# Patient Record
Sex: Male | Born: 1985
Health system: Southern US, Community
[De-identification: ages and names within clinical notes are randomized; demographics above are authoritative.]

## PROBLEM LIST (undated history)

## (undated) DIAGNOSIS — I10 Essential (primary) hypertension: Secondary | ICD-10-CM

## (undated) DIAGNOSIS — E785 Hyperlipidemia, unspecified: Secondary | ICD-10-CM

## (undated) DIAGNOSIS — E079 Disorder of thyroid, unspecified: Secondary | ICD-10-CM

## (undated) HISTORY — DX: Hyperlipidemia, unspecified: E78.5

## (undated) HISTORY — DX: Disorder of thyroid, unspecified: E07.9

## (undated) HISTORY — DX: Essential (primary) hypertension: I10

---
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
## 2008-03-22 ENCOUNTER — Emergency Department (HOSPITAL_COMMUNITY): Admission: EM | Admit: 2008-03-22 | Discharge: 2008-03-22 | Payer: Self-pay | Admitting: Emergency Medicine

## 2008-03-30 ENCOUNTER — Emergency Department (HOSPITAL_COMMUNITY): Admission: EM | Admit: 2008-03-30 | Discharge: 2008-03-30 | Payer: Self-pay | Admitting: Emergency Medicine

## 2009-10-26 ENCOUNTER — Emergency Department (HOSPITAL_BASED_OUTPATIENT_CLINIC_OR_DEPARTMENT_OTHER): Admission: EM | Admit: 2009-10-26 | Discharge: 2009-10-26 | Payer: Self-pay | Admitting: Emergency Medicine

## 2009-11-16 ENCOUNTER — Emergency Department (HOSPITAL_COMMUNITY): Admission: EM | Admit: 2009-11-16 | Discharge: 2009-11-16 | Payer: Self-pay | Admitting: Family Medicine

## 2012-08-10 ENCOUNTER — Ambulatory Visit (INDEPENDENT_AMBULATORY_CARE_PROVIDER_SITE_OTHER): Payer: 59 | Admitting: Internal Medicine

## 2012-08-10 ENCOUNTER — Encounter: Payer: Self-pay | Admitting: Internal Medicine

## 2012-08-10 ENCOUNTER — Other Ambulatory Visit (INDEPENDENT_AMBULATORY_CARE_PROVIDER_SITE_OTHER): Payer: 59

## 2012-08-10 VITALS — BP 130/90 | HR 79 | Temp 97.7°F | Resp 16 | Wt 189.2 lb

## 2012-08-10 DIAGNOSIS — I10 Essential (primary) hypertension: Secondary | ICD-10-CM

## 2012-08-10 DIAGNOSIS — E7800 Pure hypercholesterolemia, unspecified: Secondary | ICD-10-CM

## 2012-08-10 LAB — CBC WITH DIFFERENTIAL/PLATELET
Basophils Absolute: 0.1 10*3/uL (ref 0.0–0.1)
Basophils Relative: 1.2 % (ref 0.0–3.0)
HCT: 44.8 % (ref 39.0–52.0)
Hemoglobin: 14.8 g/dL (ref 13.0–17.0)
Lymphs Abs: 2.9 10*3/uL (ref 0.7–4.0)
MCHC: 33.1 g/dL (ref 30.0–36.0)
Monocytes Relative: 11.4 % (ref 3.0–12.0)
Neutro Abs: 1.5 10*3/uL (ref 1.4–7.7)
RBC: 4.69 Mil/uL (ref 4.22–5.81)
RDW: 12.3 % (ref 11.5–14.6)

## 2012-08-10 LAB — URINALYSIS, ROUTINE W REFLEX MICROSCOPIC
Bilirubin Urine: NEGATIVE
Hgb urine dipstick: NEGATIVE
Ketones, ur: NEGATIVE
Leukocytes, UA: NEGATIVE
Specific Gravity, Urine: 1.025 (ref 1.000–1.030)
Total Protein, Urine: NEGATIVE
Urine Glucose: NEGATIVE
Urobilinogen, UA: 1 (ref 0.0–1.0)

## 2012-08-10 LAB — LIPID PANEL
HDL: 29.7 mg/dL — ABNORMAL LOW (ref >39.00)
LDL Cholesterol: 138 mg/dL — ABNORMAL HIGH (ref 0–99)
Total CHOL/HDL Ratio: 6
Triglycerides: 63 mg/dL (ref 0.0–149.0)

## 2012-08-10 LAB — COMPREHENSIVE METABOLIC PANEL
ALT: 28 U/L (ref 0–53)
AST: 26 U/L (ref 0–37)
BUN: 13 mg/dL (ref 6–23)
Creatinine, Ser: 1 mg/dL (ref 0.4–1.5)
GFR: 120.38 mL/min (ref >60.00)
Total Bilirubin: 0.8 mg/dL (ref 0.3–1.2)

## 2012-08-10 NOTE — Patient Instructions (Signed)

## 2012-08-10 NOTE — Assessment & Plan Note (Signed)
He is not willing to start an antihypertensive at this time. Today I will check his labs to look for end organ damage and secondary causes. He will work on his lifestyle modifications.

## 2012-08-10 NOTE — Progress Notes (Signed)
  Subjective:    Patient ID: Donald Russell, male    DOB: 1985/09/29, 26 y.o.   MRN: [id number]  Hypertension This is a new problem. The current episode started in the past 7 days. The problem is unchanged. The problem is controlled. Pertinent negatives include no anxiety, blurred vision, chest pain, headaches, malaise/fatigue, neck pain, orthopnea, palpitations, peripheral edema, PND, shortness of breath or sweats. There are no associated agents to hypertension. Risk factors for coronary artery disease include male gender. Past treatments include nothing. The current treatment provides no improvement. There are no compliance problems.       Review of Systems  Constitutional: Negative.  Negative for malaise/fatigue.  HENT: Negative.  Negative for neck pain.   Eyes: Negative.  Negative for blurred vision.  Respiratory: Negative for cough, choking, chest tightness, shortness of breath, wheezing and stridor.   Cardiovascular: Negative for chest pain, palpitations, orthopnea, leg swelling and PND.  Gastrointestinal: Negative for nausea, vomiting, abdominal pain, diarrhea and constipation.  Genitourinary: Negative.   Musculoskeletal: Negative.   Skin: Negative.   Neurological: Negative.  Negative for headaches.  Hematological: Negative for adenopathy. Does not bruise/bleed easily.  Psychiatric/Behavioral: Negative.        Objective:   Physical Exam  Vitals reviewed. Constitutional: He is oriented to person, place, and time. He appears well-developed and well-nourished. No distress.  HENT:  Head: Normocephalic and atraumatic.  Mouth/Throat: Oropharynx is clear and moist. No oropharyngeal exudate.  Eyes: Conjunctivae normal are normal. Right eye exhibits no discharge. Left eye exhibits no discharge. No scleral icterus.  Neck: Normal range of motion. Neck supple. No JVD present. No tracheal deviation present. No thyromegaly present.  Cardiovascular: Normal rate, regular rhythm, normal  heart sounds and intact distal pulses.  Exam reveals no gallop and no friction rub.   No murmur heard. Pulmonary/Chest: Effort normal and breath sounds normal. No stridor. No respiratory distress. He has no wheezes. He has no rales. He exhibits no tenderness.  Abdominal: Soft. Bowel sounds are normal. He exhibits no distension and no mass. There is no tenderness. There is no rebound and no guarding.  Musculoskeletal: Normal range of motion. He exhibits no edema and no tenderness.  Lymphadenopathy:    He has no cervical adenopathy.  Neurological: He is oriented to person, place, and time.  Skin: Skin is warm and dry. No rash noted. He is not diaphoretic. No erythema. No pallor.  Psychiatric: He has a normal mood and affect. His behavior is normal. Judgment and thought content normal.      No results found for this basename: WBC, HGB, HCT, PLT, GLUCOSE, CHOL, TRIG, HDL, LDLDIRECT, LDLCALC, ALT, AST, NA, K, CL, CREATININE, BUN, CO2, TSH, PSA, INR, GLUF, HGBA1C, MICROALBUR      Assessment & Plan:

## 2012-08-10 NOTE — Assessment & Plan Note (Signed)
I will check his FLP TSH and CMP today

## 2015-06-02 ENCOUNTER — Emergency Department (HOSPITAL_COMMUNITY)
Admission: EM | Admit: 2015-06-02 | Discharge: 2015-06-02 | Disposition: A | Discharge status: Home / Self Care | Payer: Worker's Compensation | Attending: Emergency Medicine | Admitting: Emergency Medicine

## 2015-06-02 ENCOUNTER — Emergency Department (HOSPITAL_COMMUNITY): Payer: Worker's Compensation

## 2015-06-02 ENCOUNTER — Encounter (HOSPITAL_COMMUNITY): Payer: Self-pay | Admitting: Emergency Medicine

## 2015-06-02 DIAGNOSIS — S59912A Unspecified injury of left forearm, initial encounter: Secondary | ICD-10-CM | POA: Insufficient documentation

## 2015-06-02 DIAGNOSIS — Z720 Tobacco use: Secondary | ICD-10-CM | POA: Diagnosis not present

## 2015-06-02 DIAGNOSIS — I10 Essential (primary) hypertension: Secondary | ICD-10-CM | POA: Insufficient documentation

## 2015-06-02 DIAGNOSIS — M79602 Pain in left arm: Secondary | ICD-10-CM

## 2015-06-02 DIAGNOSIS — Y9389 Activity, other specified: Secondary | ICD-10-CM | POA: Insufficient documentation

## 2015-06-02 DIAGNOSIS — S6992XA Unspecified injury of left wrist, hand and finger(s), initial encounter: Secondary | ICD-10-CM | POA: Diagnosis not present

## 2015-06-02 DIAGNOSIS — Z8639 Personal history of other endocrine, nutritional and metabolic disease: Secondary | ICD-10-CM | POA: Diagnosis not present

## 2015-06-02 DIAGNOSIS — M25532 Pain in left wrist: Secondary | ICD-10-CM

## 2015-06-02 DIAGNOSIS — Y92410 Unspecified street and highway as the place of occurrence of the external cause: Secondary | ICD-10-CM | POA: Diagnosis not present

## 2015-06-02 DIAGNOSIS — Y998 Other external cause status: Secondary | ICD-10-CM | POA: Insufficient documentation

## 2015-06-02 MED ORDER — NAPROXEN 250 MG PO TABS: 250.0000 mg | ORAL_TABLET | Freq: Two times a day (BID) | ORAL | Status: DC
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
## 2015-06-02 NOTE — ED Notes (Signed)
Awake. Verbally responsive. A/O x4. Resp even and unlabored. No audible adventitious breath sounds noted. ABC's intact.  
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
## 2015-06-10 ENCOUNTER — Encounter: Payer: Self-pay | Admitting: Internal Medicine

## 2015-06-10 ENCOUNTER — Ambulatory Visit (INDEPENDENT_AMBULATORY_CARE_PROVIDER_SITE_OTHER): Payer: 59 | Admitting: Internal Medicine

## 2015-06-10 VITALS — BP 136/88 | HR 89 | Temp 98.6°F | Resp 16 | Wt 195.0 lb

## 2015-06-10 DIAGNOSIS — R51 Headache: Secondary | ICD-10-CM | POA: Diagnosis not present

## 2015-06-10 DIAGNOSIS — R519 Headache, unspecified: Secondary | ICD-10-CM

## 2015-06-10 DIAGNOSIS — I10 Essential (primary) hypertension: Secondary | ICD-10-CM | POA: Diagnosis not present

## 2015-06-10 MED ORDER — TRAMADOL HCL 50 MG PO TABS
50.0000 mg | ORAL_TABLET | Freq: Three times a day (TID) | ORAL | Status: DC | PRN
Start: 2015-06-10 — End: 2016-05-04

## 2015-06-10 MED ORDER — METOPROLOL TARTRATE 25 MG PO TABS: 25.0000 mg | ORAL_TABLET | Freq: Two times a day (BID) | ORAL | Status: DC

## 2015-06-10 NOTE — Patient Instructions (Addendum)
Minimal Blood Pressure Goal= AVERAGE < 140/90;  Ideal is an AVERAGE < 135/85. This AVERAGE should be calculated from @ least 5-7 BP readings taken @ different times of day on different days of week. You should not respond to isolated BP readings , but rather the AVERAGE for that week .Please bring your  blood pressure cuff to office visits to verify that it is reliable.It  can also be checked against the blood pressure device at the pharmacy. Finger or wrist cuffs are not dependable; an arm cuff is.  Fill the  prescription for the BP medication if BP NOT @ goal based on  7 to 14 day average.  Please think about quitting smoking for good. Review the risks we discussed. Please call 1-800-QUIT-NOW [phone] 151-4347) for free smoking cessation counseling. There are multiple options are to help you stop smoking. These include nicotine patches and nicotine gum.  Please keep a diary of your headaches . Document  each occurrence on the calendar with notation of : #1 any prodrome ( any non headache symptom such as marked fatigue,visual changes, ,etc ) which precedes actual headache ; #2) severity on 1-10 scale; #3) any triggers ( food/ drink,enviromenntal or weather changes ,physical or emotional stress) in 8-12 hour period prior to the headache; & #4) response to any medications or other intervention. Please review "Headache" @ [facility] for additional information.

## 2015-06-10 NOTE — Progress Notes (Signed)
Pre visit review using our clinic review tool, if applicable. No additional management support is needed unless otherwise documented below in the visit note. 
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
## 2016-05-04 ENCOUNTER — Encounter (HOSPITAL_COMMUNITY): Payer: Self-pay | Admitting: Family Medicine

## 2016-05-04 ENCOUNTER — Ambulatory Visit: Payer: Self-pay | Admitting: Internal Medicine

## 2016-05-04 ENCOUNTER — Ambulatory Visit (HOSPITAL_COMMUNITY)
Admission: EM | Admit: 2016-05-04 | Discharge: 2016-05-04 | Disposition: A | Discharge status: Home / Self Care | Payer: Commercial Managed Care - HMO | Attending: Family Medicine | Admitting: Family Medicine

## 2016-05-04 DIAGNOSIS — G44209 Tension-type headache, unspecified, not intractable: Secondary | ICD-10-CM

## 2016-05-04 DIAGNOSIS — I10 Essential (primary) hypertension: Secondary | ICD-10-CM

## 2016-05-04 MED ORDER — LISINOPRIL 10 MG PO TABS: 10.0000 mg | ORAL_TABLET | Freq: Every day | ORAL | 0 refills | Status: DC

## 2016-05-04 NOTE — ED Triage Notes (Signed)
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

## 2016-05-04 NOTE — ED Provider Notes (Signed)
[facility]    CSN: [id number] Arrival date & time: 05/04/16  1107  First Provider Contact:  First MD Initiated Contact with Patient 05/04/16 1200     History   Chief Complaint Chief Complaint  Patient presents with  . Hypertension   HPI Donald Russell is a 30 y.o. male presenting for hypertension.   He reports being diagnosed with HTN at ED, followed up with PCP for their first visit and prescribed metoprolol about 1 year ago. He reports feeling sluggish when taking this. He took it once a day (not twice per the prescription) and has been out for the past 2 months. He called PCP today for appointment to be seen for headache and to get a refill, but was told no appointments were available today.   He's had increasing stress at work, a young child, and trouble paying bills and thinks that is causing his headache which starts on both sides of his neck, lasts until he falls asleep, daily for the past week, waxing/waning. He's taken no medications for this. No photophobia, N/V.   Past Medical History:  Diagnosis Date  . Hyperlipidemia   . Hypertension   . Thyroid disease     Patient Active Problem List   Diagnosis Date Noted  . Essential hypertension, benign 08/10/2012  . Pure hypercholesterolemia 08/10/2012    History reviewed. No pertinent surgical history.  Home Medications    Prior to Admission medications   Medication Sig Start Date End Date Taking? Authorizing Provider  lisinopril (PRINIVIL,ZESTRIL) 10 MG tablet Take 1 tablet (10 mg total) by mouth daily. 05/04/16   Tyrone Nineyan B Raianna Slight, MD    Family History Family History  Problem Relation Age of Onset  . Hypertension Mother   . Hypertension Brother   . Alcohol abuse Neg Hx   . Cancer Neg Hx   . Diabetes Neg Hx   . Early death Neg Hx   . Hearing loss Neg Hx   . Heart disease Neg Hx   . Hyperlipidemia Neg Hx   . Stroke Neg Hx     Social History Social History  Substance Use Topics  . Smoking status:  Current Some Day Smoker    Packs/day: 0.25    Years: 3.00    Types: Cigarettes  . Smokeless tobacco: Never Used  . Alcohol use 1.8 oz/week    3 Cans of beer per week     Allergies   Review of patient's allergies indicates no known allergies.   Review of Systems Review of Systems Denies CP, SOB, palpitations, syncope, dizziness, orthopnea, PND, vision changes, claudication, leg swelling.  Physical Exam Triage Vital Signs ED Triage Vitals [05/04/16 1206]  Enc Vitals Group     BP (!) 154/106     Pulse Rate 78     Resp 16     Temp 98.6 F (37 C)     Temp Source Oral     SpO2 100 %     Weight      Height      Head Circumference      Peak Flow      Pain Score      Pain Loc      Pain Edu?      Excl. in GC?    No data found.   Updated Vital Signs BP (!) 154/106 (BP Location: Right Arm)   Pulse 78   Temp 98.6 F (37 C) (Oral)   Resp 16   SpO2 100%  Physical Exam  Constitutional: He is oriented to person, place, and time. He appears well-developed and well-nourished. No distress.  Eyes: EOM are normal. Pupils are equal, round, and reactive to light. No scleral icterus.  fundi benign  Neck: Neck supple. No JVD present.  Cardiovascular: Normal rate, regular rhythm, normal heart sounds and intact distal pulses.   No murmur heard. on manual recheck, 150/90  Pulmonary/Chest: Effort normal and breath sounds normal. No respiratory distress.  Abdominal: Soft. Bowel sounds are normal. He exhibits no distension. There is no tenderness.  Musculoskeletal: Normal range of motion. He exhibits no edema or tenderness.  Lymphadenopathy:    He has no cervical adenopathy.  Neurological: He is alert and oriented to person, place, and time. He exhibits normal muscle tone.  Skin: Skin is warm and dry.  Vitals reviewed.  UC Treatments / Results  Labs (all labs ordered are listed, but only abnormal results are displayed) Labs Reviewed - No data to display  EKG  EKG  Interpretation None       Radiology No results found.  Procedures Procedures (including critical care time)  Medications Ordered in UC Medications - No data to display   Initial Impression / Assessment and Plan / UC Course  I have reviewed the triage vital signs and the nursing notes.  Pertinent labs & imaging results that were available during my care of the patient were reviewed by me and considered in my medical decision making (see chart for details).  Final Clinical Impressions(s) / UC Diagnoses   Final diagnoses:  Essential hypertension, benign  Tension-type headache, not intractable, unspecified chronicity pattern   30 y.o. male with HTN untreated. On my recheck BP is not in severe range, doubt HTN urgency. HA likely tension-type with benign exam. Due to h/o sluggishness with metoprolol, this is discontinued and lisinopril started with precautions about SE's, return precautions for HTN urgency, and directions to f/u with PCP in 2 weeks for BP recheck and Cr, K check.   New Prescriptions New Prescriptions   LISINOPRIL (PRINIVIL,ZESTRIL) 10 MG TABLET    Take 1 tablet (10 mg total) by mouth daily.     Tyrone Nineyan B Jazen Spraggins, MD 05/04/16 1344

## 2016-05-04 NOTE — Discharge Instructions (Signed)
It's very important for you to not run out of medication. START taking lisinopril for high blood pressure. If you notice swelling or a dry cough, contact your PCP. Either way, schedule an appointment in 2 weeks with your PCP for BP recheck and labs. If you experience any worsening symptoms, return for urgent care.

## 2016-09-29 ENCOUNTER — Ambulatory Visit: Payer: Commercial Managed Care - HMO | Admitting: Internal Medicine

## 2018-06-08 ENCOUNTER — Ambulatory Visit: Payer: Self-pay | Admitting: Family Medicine

## 2018-07-05 ENCOUNTER — Ambulatory Visit: Payer: 59 | Admitting: Family Medicine

## 2018-07-05 ENCOUNTER — Encounter: Payer: Self-pay | Admitting: Family Medicine

## 2018-07-05 VITALS — BP 150/102 | HR 89 | Temp 98.0°F | Ht 65.5 in | Wt 206.4 lb

## 2018-07-05 DIAGNOSIS — Z23 Encounter for immunization: Secondary | ICD-10-CM | POA: Diagnosis not present

## 2018-07-05 DIAGNOSIS — I10 Essential (primary) hypertension: Secondary | ICD-10-CM | POA: Diagnosis not present

## 2018-07-05 DIAGNOSIS — L0291 Cutaneous abscess, unspecified: Secondary | ICD-10-CM | POA: Diagnosis not present

## 2018-07-05 DIAGNOSIS — E669 Obesity, unspecified: Secondary | ICD-10-CM | POA: Diagnosis not present

## 2018-07-05 LAB — COMPREHENSIVE METABOLIC PANEL
ALK PHOS: 55 U/L (ref 39–117)
ALT: 26 U/L (ref 0–53)
AST: 20 U/L (ref 0–37)
Albumin: 4.7 g/dL (ref 3.5–5.2)
BILIRUBIN TOTAL: 0.4 mg/dL (ref 0.2–1.2)
BUN: 17 mg/dL (ref 6–23)
CALCIUM: 10.1 mg/dL (ref 8.4–10.5)
CO2: 28 mEq/L (ref 19–32)
CREATININE: 0.94 mg/dL (ref 0.40–1.50)
Chloride: 103 mEq/L (ref 96–112)
GFR: 120.89 mL/min (ref >60.00)
Glucose, Bld: 102 mg/dL — ABNORMAL HIGH (ref 70–99)
Potassium: 4.4 mEq/L (ref 3.5–5.1)
SODIUM: 140 meq/L (ref 135–145)
TOTAL PROTEIN: 7.5 g/dL (ref 6.0–8.3)

## 2018-07-05 LAB — CBC
HCT: 41 % (ref 39.0–52.0)
Hemoglobin: 14 g/dL (ref 13.0–17.0)
MCHC: 34.1 g/dL (ref 30.0–36.0)
MCV: 93.9 fl (ref 78.0–100.0)
PLATELETS: 244 10*3/uL (ref 150.0–400.0)
RBC: 4.36 Mil/uL (ref 4.22–5.81)
RDW: 12.1 % (ref 11.5–15.5)
WBC: 8 10*3/uL (ref 4.0–10.5)

## 2018-07-05 LAB — LIPID PANEL
CHOL/HDL RATIO: 5
CHOLESTEROL: 198 mg/dL (ref 0–200)
HDL: 37.2 mg/dL — AB (ref >39.00)
LDL Cholesterol: 127 mg/dL — ABNORMAL HIGH (ref 0–99)
NonHDL: 160.31
TRIGLYCERIDES: 168 mg/dL — AB (ref 0.0–149.0)
VLDL: 33.6 mg/dL (ref 0.0–40.0)

## 2018-07-05 LAB — TSH: TSH: 3.62 u[IU]/mL (ref 0.35–4.50)

## 2018-07-05 MED ORDER — DOXYCYCLINE HYCLATE 100 MG PO TABS: 100.0000 mg | ORAL_TABLET | Freq: Two times a day (BID) | ORAL | 0 refills | Status: DC

## 2018-07-05 MED ORDER — NAPROXEN 500 MG PO TABS: 500.0000 mg | ORAL_TABLET | Freq: Two times a day (BID) | ORAL | 0 refills | Status: DC

## 2018-07-05 MED ORDER — AMLODIPINE BESYLATE 5 MG PO TABS: 5.0000 mg | ORAL_TABLET | Freq: Every day | ORAL | 3 refills | Status: DC

## 2018-07-05 NOTE — Assessment & Plan Note (Addendum)
-  BP is uncontrolled.  -Reports side effects with lisinopril and metoprolol previously. -Will try amlodipine 5mg .  -Discussed low sodium/DASH diet -F/u in 1 month.

## 2018-07-05 NOTE — Patient Instructions (Signed)

## 2018-07-05 NOTE — Progress Notes (Signed)
Donald Russell - 32 y.o. male MRN [id number]  Date of birth: [birth year]  Subjective Chief Complaint  Patient presents with  . Hypertension  . Cyst    HPI Donald Russell is a 32 y.o. male with history of HTN here today to establish care and follow up on HTN.  He also has swollen area on L side of face.    -HTN:  Previously treated with metoprolol however felt too sluggish while taking this, changed to lisinopril however has been off of this for nearly a year.  Reports he felt "funny" while taking this medication, so he stopped it.  He is unable to recall specifics of how this made him feel.  He denies any symptoms related to hypertension at this time including chest pain, shortness of breath, palpitations, or headaches.  He does admit that his diet could be better in regards to salt intake.  He does do weightlifting for exercise regularly.   -Swollen area on face:  Noticed swollen area to L jaw line about 4-5 days ago.  Thinks it is an ingrown hair, similar to previous ingrown hairs but this one is larger.  Area if painful to touch and he also has some mild neck pain.  He is using ibuprofen as needed which provides relief.  He has had some drainage from this area.  Denies fever or chills.   ROS:  A comprehensive ROS was completed and negative except as noted per HPI  No Known Allergies  Past Medical History:  Diagnosis Date  . Hyperlipidemia   . Hypertension   . Thyroid disease     History reviewed. No pertinent surgical history.  Social History   Socioeconomic History  . Marital status: Single    Spouse name: Not on file  . Number of children: Not on file  . Years of education: Not on file  . Highest education level: Not on file  Occupational History  . Not on file  Social Needs  . Financial resource strain: Not on file  . Food insecurity:    Worry: Not on file    Inability: Not on file  . Transportation needs:    Medical: Not on file    Non-medical: Not on file    Tobacco Use  . Smoking status: Former Smoker    Packs/day: 0.25    Years: 3.00    Pack years: 0.75    Types: Cigarettes  . Smokeless tobacco: Never Used  Substance and Sexual Activity  . Alcohol use: Yes    Alcohol/week: 3.0 standard drinks    Types: 3 Cans of beer per week  . Drug use: No  . Sexual activity: Yes  Lifestyle  . Physical activity:    Days per week: Not on file    Minutes per session: Not on file  . Stress: Not on file  Relationships  . Social connections:    Talks on phone: Not on file    Gets together: Not on file    Attends religious service: Not on file    Active member of club or organization: Not on file    Attends meetings of clubs or organizations: Not on file    Relationship status: Not on file  Other Topics Concern  . Not on file  Social History Narrative  . Not on file    Family History  Problem Relation Age of Onset  . Hypertension Mother   . Hypertension Brother   . Kidney disease Brother   .  Alcohol abuse Neg Hx   . Cancer Neg Hx   . Diabetes Neg Hx   . Early death Neg Hx   . Hearing loss Neg Hx   . Heart disease Neg Hx   . Hyperlipidemia Neg Hx   . Stroke Neg Hx     Health Maintenance  Topic Date Due  . TETANUS/TDAP  02/22/2007  . INFLUENZA VACCINE  07/05/2018 (Originally 04/19/2018)  . HIV Screening  07/06/2019 (Originally 02/22/2003)    ----------------------------------------------------------------------------------------------------------------------------------------------------------------------------------------------------------------- Physical Exam BP (!) 150/102   Pulse 89   Temp 98 F (36.7 C) (Oral)   Ht 5' 5.5" (1.664 m)   Wt 206 lb 6.4 oz (93.6 kg)   SpO2 98%   BMI 33.82 kg/m   Physical Exam  Constitutional: He is oriented to person, place, and time. He appears well-nourished. No distress.  HENT:  Head: Normocephalic and atraumatic.  Mouth/Throat: Oropharynx is clear and moist.  Eyes: No scleral icterus.   Neck: Neck supple. No thyromegaly present.  Cardiovascular: Normal rate, regular rhythm, normal heart sounds and intact distal pulses.  Pulmonary/Chest: Effort normal and breath sounds normal.  Musculoskeletal: He exhibits no edema.  Neurological: He is alert and oriented to person, place, and time. No cranial nerve deficit.  Skin:  Indurated area along L jaw line with ttp and surrounding erythema.  Mild fluctuance with spontaneous drainage.    Psychiatric: He has a normal mood and affect. His behavior is normal.    ------------------------------------------------------------------------------------------------------------------------------------------------------------------------------------------------------------------- Assessment and Plan  Abscess -Area with spontaneous drainage, encouraged use of warm compress -Rx for doxycycline for surrounding cellulitis.  -Instructed to return for worsening or failure to resolve with antibiotics.   Essential hypertension -BP is uncontrolled.  -Reports side effects with lisinopril and metoprolol previously. -Will try amlodipine 5mg .  -Discussed low sodium/DASH diet -F/u in 1 month.

## 2018-07-05 NOTE — Assessment & Plan Note (Signed)
-  Area with spontaneous drainage, encouraged use of warm compress -Rx for doxycycline for surrounding cellulitis.  -Instructed to return for worsening or failure to resolve with antibiotics.

## 2018-07-06 ENCOUNTER — Ambulatory Visit: Payer: 59 | Admitting: Family Medicine

## 2018-07-10 NOTE — Progress Notes (Signed)
-  Cholesterol is a little high.  Can be improved with low fat diet and regular exercise.  No medication needed at this time.  -Other labs are normal.

## 2019-01-14 ENCOUNTER — Telehealth: Payer: Self-pay | Admitting: Family Medicine

## 2019-01-14 NOTE — Telephone Encounter (Signed)
Copied from CRM [id number]. Topic: Quick Communication - Rx Refill/Question >> Jan 14, 2019  4:54 PM Mcneil, Ja-Kwan wrote: Medication: amLODipine (NORVASC) 5 MG tablet  Has the patient contacted their pharmacy? no  Preferred Pharmacy (with phone number or street name): [facility] [address] [place], [place] - [address] [phone] (Phone)  [phone] (Fax)  Agent: Please be advised that RX refills may take up to 3 business days. We ask that you follow-up with your pharmacy.

## 2019-01-15 NOTE — Telephone Encounter (Signed)
Pt needs OV . LOV was 07/07/2018. Will call to get scheduled.

## 2019-01-15 NOTE — Telephone Encounter (Signed)
Patient has appointment scheduled for 4/29.

## 2019-01-15 NOTE — Telephone Encounter (Signed)
Called Pt. Left a message to make appt w/Dr. Ashley Royalty.

## 2019-01-15 NOTE — Telephone Encounter (Signed)
[facility]. Was given d different phone number ( [phone]) . Tried calling that number, left a message to call to make appt.

## 2019-01-16 ENCOUNTER — Encounter: Payer: Self-pay | Admitting: Family Medicine

## 2019-01-16 ENCOUNTER — Ambulatory Visit (INDEPENDENT_AMBULATORY_CARE_PROVIDER_SITE_OTHER): Payer: 59 | Admitting: Family Medicine

## 2019-01-16 DIAGNOSIS — I10 Essential (primary) hypertension: Secondary | ICD-10-CM

## 2019-01-16 MED ORDER — AMLODIPINE BESYLATE 5 MG PO TABS: 5.0000 mg | ORAL_TABLET | Freq: Every day | ORAL | 1 refills | Status: DC

## 2019-01-16 NOTE — Assessment & Plan Note (Signed)
-  Recommend that he check BP at home some, he will look in to purchasing a home cuff.  -Continue low salt diet.  -Continue amlodipine.  -F/u in clinic in 3 months.

## 2019-01-16 NOTE — Progress Notes (Signed)
Donald KaufmannDarryl D J Russell - 33 y.o. male MRN [id number]  Date of birth: 08/05/1986   This visit type was conducted due to national recommendations for restrictions regarding the COVID-19 Pandemic (e.g. social distancing).  This format is felt to be most appropriate for this patient at this time.  All issues noted in this document were discussed and addressed.  No physical exam was performed (except for noted visual exam findings with Video Visits).  I discussed the limitations of evaluation and management by telemedicine and the availability of in person appointments. The patient expressed understanding and agreed to proceed.  I connected with@ on 01/16/19 at  9:00 AM EDT by a video enabled telemedicine application and verified that I am speaking with the correct person using two identifiers.   [place] [address] [place] [id number]   [facility]  Chief Complaint  Patient presents with  . Hypertension    F/U , Meds Refills    HPI  Donald Russell is a 33 y.o. male who presents via audio/video conferencing for a telehealth visit today.  He is following up today for HTN.  Last seen in 06/2018.  Started on amlodipine at that time.  Reports he has been doing well since starting this.  He has checked BP several months ago and it had improved.  He does not have any recent readings.  He denies any symptoms related to hypertension including chest pain, shortness of breath, headache or vision changes.  He has been watching salt intake and exercising daily.    ROS:  A comprehensive ROS was completed and negative except as noted per HPI  Past Medical History:  Diagnosis Date  . Hyperlipidemia   . Hypertension   . Thyroid disease     No past surgical history on file.  Family History  Problem Relation Age of Onset  . Hypertension Mother   . Hypertension Brother   . Kidney disease Brother   . Alcohol abuse Neg Hx   . Cancer Neg Hx   . Diabetes Neg  Hx   . Early death Neg Hx   . Hearing loss Neg Hx   . Heart disease Neg Hx   . Hyperlipidemia Neg Hx   . Stroke Neg Hx     Social History   Socioeconomic History  . Marital status: Single    Spouse name: Not on file  . Number of children: Not on file  . Years of education: Not on file  . Highest education level: Not on file  Occupational History  . Not on file  Social Needs  . Financial resource strain: Not on file  . Food insecurity:    Worry: Not on file    Inability: Not on file  . Transportation needs:    Medical: Not on file    Non-medical: Not on file  Tobacco Use  . Smoking status: Former Smoker    Packs/day: 0.25    Years: 3.00    Pack years: 0.75    Types: Cigarettes  . Smokeless tobacco: Never Used  Substance and Sexual Activity  . Alcohol use: Yes    Alcohol/week: 3.0 standard drinks    Types: 3 Cans of beer per week  . Drug use: No  . Sexual activity: Yes  Lifestyle  . Physical activity:    Days per week: Not on file    Minutes per session: Not on file  . Stress: Not on file  Relationships  .  Social connections:    Talks on phone: Not on file    Gets together: Not on file    Attends religious service: Not on file    Active member of club or organization: Not on file    Attends meetings of clubs or organizations: Not on file    Relationship status: Not on file  . Intimate partner violence:    Fear of current or ex partner: Not on file    Emotionally abused: Not on file    Physically abused: Not on file    Forced sexual activity: Not on file  Other Topics Concern  . Not on file  Social History Narrative  . Not on file     Current Outpatient Medications:  .  amLODipine (NORVASC) 5 MG tablet, Take 1 tablet (5 mg total) by mouth daily., Disp: 90 tablet, Rfl: 1  EXAM:  VITALS per patient if applicable: Ht 5' 5.5" (1.664 m)   Wt 205 lb (93 kg) Comment: PT reports  BMI 33.59 kg/m   GENERAL: alert, oriented, appears well and in no acute  distress  HEENT: atraumatic, conjunttiva clear, no obvious abnormalities on inspection of external nose and ears  NECK: normal movements of the head and neck  LUNGS: on inspection no signs of respiratory distress, breathing rate appears normal, no obvious gross SOB, gasping or wheezing  CV: no obvious cyanosis  MS: moves all visible extremities without noticeable abnormality  PSYCH/NEURO: pleasant and cooperative, no obvious depression or anxiety, speech and thought processing grossly intact  ASSESSMENT AND PLAN:  Discussed the following assessment and plan:  Essential hypertension -Recommend that he check BP at home some, he will look in to purchasing a home cuff.  -Continue low salt diet.  -Continue amlodipine.  -F/u in clinic in 3 months.        I discussed the assessment and treatment plan with the patient. The patient was provided an opportunity to ask questions and all were answered. The patient agreed with the plan and demonstrated an understanding of the instructions.   The patient was advised to call back or seek an in-person evaluation if the symptoms worsen or if the condition fails to improve as anticipated.    Everrett Coombe, DO

## 2019-01-16 NOTE — Telephone Encounter (Signed)
Pt completed DOXY.ME VV. Refill will be completed at time of visit.

## 2019-01-21 ENCOUNTER — Ambulatory Visit: Payer: 59 | Admitting: Family Medicine

## 2019-01-21 ENCOUNTER — Encounter: Payer: Self-pay | Admitting: Family Medicine

## 2019-01-21 DIAGNOSIS — F321 Major depressive disorder, single episode, moderate: Secondary | ICD-10-CM | POA: Diagnosis not present

## 2019-01-21 DIAGNOSIS — I10 Essential (primary) hypertension: Secondary | ICD-10-CM

## 2019-01-21 DIAGNOSIS — B372 Candidiasis of skin and nail: Secondary | ICD-10-CM

## 2019-01-21 MED ORDER — KETOCONAZOLE 2 % EX CREA: 1.0000 "application " | TOPICAL_CREAM | Freq: Every day | CUTANEOUS | 0 refills | Status: DC

## 2019-01-21 NOTE — Assessment & Plan Note (Signed)
-  BP is high today, currently off medication. -encouraged to pick up and restart this immediately.  -f/u in 6 weeks for BP recheck.

## 2019-01-21 NOTE — Assessment & Plan Note (Signed)
-  Discussed recommendations for trial of medication, however he does not want to try this at this time.  -Given number to schedule with therapist, he will consider this.  -He will contact me if symptoms continue, worsen and/or he would like to try medication.

## 2019-01-21 NOTE — Progress Notes (Signed)
Donald KaufmannDarryl D J Russell - 33 y.o. male MRN [id number]  Date of birth: 05/03/1986  Subjective Chief Complaint  Patient presents with  . Rash    in groin area, irr/redness/protection used, partner recent Yeast infection 2-3 wks ago     HPI Donald Russell is a 33 y.o. male here today with concern of rash.  He also has symptoms of depression and would like to f/u on HTN.   -Rash:  Rash located in groin area, noticed about 2 weeks ago.  Describes as irritation with occasional itching.  He denies any swelling or drainage from the area.  He denies bumps/sores to this area. He has not had fever, chills, testicular pain or penile discharge.  He is using cortisone to area without improvement.  He has used condoms with intercourse every time.   -Depression:  Reports increasing depressive symptoms over the past couple of months.  States that having to stay home due to COVID has worsened this due to isolation.  He also reports that he and the mother of his child are not getting along but are quarantining together for the child, however this has put a tremendous amount of stress on him.  He reports that he is only eating once per day, sleeping poorly, and has little motivation.  He is continuing to exercise which helps some.  He does not want to take medication but will consider counseling.  He denies SI/HI  -HTN:  Out of medication for at least the past week as he has not picked up refills due to depression and not wanting to leave the house.  Has had mild headache, denies vision changes or nausea.  He has not had chest pain, or shortness of breath.   ROS:  A comprehensive ROS was completed and negative except as noted per HPI  No Known Allergies  Past Medical History:  Diagnosis Date  . Hyperlipidemia   . Hypertension   . Thyroid disease     No past surgical history on file.  Social History   Socioeconomic History  . Marital status: Single    Spouse name: Not on file  . Number of children: Not on  file  . Years of education: Not on file  . Highest education level: Not on file  Occupational History  . Not on file  Social Needs  . Financial resource strain: Not on file  . Food insecurity:    Worry: Not on file    Inability: Not on file  . Transportation needs:    Medical: Not on file    Non-medical: Not on file  Tobacco Use  . Smoking status: Former Smoker    Packs/day: 0.25    Years: 3.00    Pack years: 0.75    Types: Cigarettes  . Smokeless tobacco: Never Used  Substance and Sexual Activity  . Alcohol use: Yes    Alcohol/week: 3.0 standard drinks    Types: 3 Cans of beer per week  . Drug use: No  . Sexual activity: Yes  Lifestyle  . Physical activity:    Days per week: Not on file    Minutes per session: Not on file  . Stress: Not on file  Relationships  . Social connections:    Talks on phone: Not on file    Gets together: Not on file    Attends religious service: Not on file    Active member of club or organization: Not on file    Attends meetings of  clubs or organizations: Not on file    Relationship status: Not on file  Other Topics Concern  . Not on file  Social History Narrative  . Not on file    Family History  Problem Relation Age of Onset  . Hypertension Mother   . Hypertension Brother   . Kidney disease Brother   . Alcohol abuse Neg Hx   . Cancer Neg Hx   . Diabetes Neg Hx   . Early death Neg Hx   . Hearing loss Neg Hx   . Heart disease Neg Hx   . Hyperlipidemia Neg Hx   . Stroke Neg Hx     Health Maintenance  Topic Date Due  . HIV Screening  07/06/2019 (Originally 02/22/2003)  . INFLUENZA VACCINE  04/20/2019  . TETANUS/TDAP  07/05/2028    ----------------------------------------------------------------------------------------------------------------------------------------------------------------------------------------------------------------- Physical Exam BP (!) 160/122 (BP Location: Left Arm, Patient Position: Sitting, Cuff  Size: Large)   Pulse 73   Temp 97.7 F (36.5 C) (Oral)   Wt 196 lb 3.2 oz (89 kg)   SpO2 96%   BMI 32.15 kg/m   Physical Exam Constitutional:      Appearance: Normal appearance.  HENT:     Head: Normocephalic and atraumatic.     Nose: Nose normal.     Mouth/Throat:     Mouth: Mucous membranes are moist.  Eyes:     General: No scleral icterus. Neck:     Musculoskeletal: Neck supple.  Cardiovascular:     Rate and Rhythm: Normal rate and regular rhythm.  Pulmonary:     Effort: Pulmonary effort is normal.     Breath sounds: Normal breath sounds.  Skin:    Comments: Erythema and mild ulceration along groin and inner thigh.  No vesicular lesions, testicular swelling or penile discharge.   Neurological:     General: No focal deficit present.     Mental Status: He is alert.  Psychiatric:        Mood and Affect: Mood normal.        Behavior: Behavior normal.     ------------------------------------------------------------------------------------------------------------------------------------------------------------------------------------------------------------------- Assessment and Plan  Candidal intertrigo -rx for ketoconazole cream -Keep area clean and dry -Discussed that this did not have appearance of typical STI and he declines any testing at this time as well.   MDD (major depressive disorder), single episode, moderate (HCC) -Discussed recommendations for trial of medication, however he does not want to try this at this time.  -Given number to schedule with therapist, he will consider this.  -He will contact me if symptoms continue, worsen and/or he would like to try medication.   Essential hypertension -BP is high today, currently off medication. -encouraged to pick up and restart this immediately.  -f/u in 6 weeks for BP recheck.

## 2019-01-21 NOTE — Assessment & Plan Note (Signed)
-  rx for ketoconazole cream -Keep area clean and dry -Discussed that this did not have appearance of typical STI and he declines any testing at this time as well.

## 2019-01-21 NOTE — Patient Instructions (Signed)
-  Be sure to restart blood pressure medication -I recommend that you see a counselor for your depression symptoms -I would also recommend medication to help with this, please let me know if you would like to try this as discussed today.  -Follow up with me in 6 weeks for blood pressure recheck.  -I have sent cream in for the rash you have as well.    Major Depressive Disorder, Adult Major depressive disorder (MDD) is a mental health condition. MDD often makes you feel sad, hopeless, or helpless. MDD can also cause symptoms in your body. MDD can affect your:  Work.  School.  Relationships.  Other normal activities. MDD can range from mild to very bad. It may occur once (single episode MDD). It can also occur many times (recurrent MDD). The main symptoms of MDD often include:  Feeling sad, depressed, or irritable most of the time.  Loss of interest. MDD symptoms also include:  Sleeping too much or too little.  Eating too much or too little.  A change in your weight.  Feeling tired (fatigue) or having low energy.  Feeling worthless.  Feeling guilty.  Trouble making decisions.  Trouble thinking clearly.  Thoughts of suicide or harming others.  Feeling weak.  Feeling agitated.  Keeping yourself from being around other people (isolation). Follow these instructions at home: Activity  Do these things as told by your doctor: ? Go back to your normal activities. ? Exercise regularly. ? Spend time outdoors. Alcohol  Talk with your doctor about how alcohol can affect your antidepressant medicines.  Do not drink alcohol. Or, limit how much alcohol you drink. ? This means no more than 1 drink a day for nonpregnant women and 2 drinks a day for men. One drink equals one of these:  12 oz of beer.  5 oz of wine.  1 oz of hard liquor. General instructions  Take over-the-counter and prescription medicines only as told by your doctor.  Eat a healthy diet.  Get  plenty of sleep.  Find activities that you enjoy. Make time to do them.  Think about joining a support group. Your doctor may be able to suggest a group for you.  Keep all follow-up visits as told by your doctor. This is important. Where to find more information:  [employer] on Mental Illness: ? www.nami.org  [place] [employer] of Mental Health: ? http://www.maynard.net/  National Suicide Prevention Lifeline: ? (380)635-0737. This is free, 24-hour help. Contact a doctor if:  Your symptoms get worse.  You have new symptoms. Get help right away if:  You self-harm.  You see, hear, taste, smell, or feel things that are not present (hallucinate). If you ever feel like you may hurt yourself or others, or have thoughts about taking your own life, get help right away. You can go to your nearest emergency department or call:  Your local emergency services (911 in the [place]  A suicide crisis helpline, such as the National Suicide Prevention Lifeline: ? 9407349949. This is open 24 hours a day. This information is not intended to replace advice given to you by your health care provider. Make sure you discuss any questions you have with your health care provider. Document Released: 08/17/2015 Document Revised: 05/22/2016 Document Reviewed: 05/22/2016 Elsevier Interactive Patient Education  2019 [employer].

## 2019-01-24 ENCOUNTER — Telehealth: Payer: Self-pay | Admitting: Family Medicine

## 2019-01-24 ENCOUNTER — Other Ambulatory Visit: Payer: Self-pay | Admitting: Family Medicine

## 2019-01-24 DIAGNOSIS — Z202 Contact with and (suspected) exposure to infections with a predominantly sexual mode of transmission: Secondary | ICD-10-CM

## 2019-01-24 NOTE — Telephone Encounter (Signed)
Lab orders entered

## 2019-01-24 NOTE — Telephone Encounter (Signed)
Patient called in and said he wants to get STD testing, I know he had an appt on 01/21/2019 so I wasn't sure if this is something Dr Ashley Royalty wants to him to have done. Sending message to Dr Ashley Royalty and Nurse Raynelle Fanning

## 2019-01-25 ENCOUNTER — Other Ambulatory Visit: Payer: Self-pay

## 2019-01-25 ENCOUNTER — Encounter: Payer: Self-pay | Admitting: Family Medicine

## 2019-01-25 ENCOUNTER — Other Ambulatory Visit (INDEPENDENT_AMBULATORY_CARE_PROVIDER_SITE_OTHER): Payer: 59

## 2019-01-25 ENCOUNTER — Other Ambulatory Visit (HOSPITAL_COMMUNITY)
Admission: RE | Admit: 2019-01-25 | Discharge: 2019-01-25 | Disposition: A | Discharge status: Home / Self Care | Payer: 59 | Source: Ambulatory Visit | Attending: Family Medicine | Admitting: Family Medicine

## 2019-01-25 DIAGNOSIS — Z202 Contact with and (suspected) exposure to infections with a predominantly sexual mode of transmission: Secondary | ICD-10-CM | POA: Diagnosis not present

## 2019-01-25 NOTE — Telephone Encounter (Signed)
Pt is scheduled. He is aware.

## 2019-01-28 ENCOUNTER — Telehealth: Payer: Self-pay

## 2019-01-28 LAB — RPR: RPR Ser Ql: NONREACTIVE

## 2019-01-28 LAB — HIV ANTIBODY (ROUTINE TESTING W REFLEX): HIV 1&2 Ab, 4th Generation: NONREACTIVE

## 2019-01-28 LAB — URINE CYTOLOGY ANCILLARY ONLY
Chlamydia: NEGATIVE
Neisseria Gonorrhea: NEGATIVE

## 2019-01-28 NOTE — Telephone Encounter (Signed)
Copied from CRM [id number]. Topic: General - Inquiry >> Jan 28, 2019  2:28 PM Lorayne Bender wrote: Reason for CRM:   Pt calling to get lab results.

## 2019-01-29 NOTE — Progress Notes (Signed)
Called Pt . He is aware.

## 2019-01-29 NOTE — Progress Notes (Signed)
Negative std testing

## 2019-01-31 ENCOUNTER — Other Ambulatory Visit: Payer: Self-pay | Admitting: Family Medicine

## 2019-01-31 ENCOUNTER — Encounter: Payer: Self-pay | Admitting: Family Medicine

## 2019-01-31 MED ORDER — MUPIROCIN 2 % EX OINT: 1.0000 "application " | TOPICAL_OINTMENT | Freq: Two times a day (BID) | CUTANEOUS | 0 refills | Status: DC

## 2019-01-31 MED ORDER — FLUCONAZOLE 150 MG PO TABS: 150.0000 mg | ORAL_TABLET | Freq: Once | ORAL | 0 refills | Status: AC

## 2019-01-31 NOTE — Telephone Encounter (Signed)
I am going to send in a different topical medication and diflucan tab.  If not clearing up with this let me know.

## 2019-02-04 ENCOUNTER — Encounter: Payer: Self-pay | Admitting: Family Medicine

## 2019-02-05 NOTE — Telephone Encounter (Signed)
Sure

## 2019-02-28 ENCOUNTER — Encounter: Payer: Self-pay | Admitting: Family Medicine

## 2019-03-04 ENCOUNTER — Encounter: Payer: Self-pay | Admitting: Family Medicine

## 2019-03-04 ENCOUNTER — Telehealth (INDEPENDENT_AMBULATORY_CARE_PROVIDER_SITE_OTHER): Payer: 59 | Admitting: Family Medicine

## 2019-03-04 DIAGNOSIS — R21 Rash and other nonspecific skin eruption: Secondary | ICD-10-CM

## 2019-03-04 DIAGNOSIS — I10 Essential (primary) hypertension: Secondary | ICD-10-CM

## 2019-03-04 NOTE — Progress Notes (Signed)
Donald Russell - 33 y.o. male MRN [id number]  Date of birth: 1986-08-11   This visit type was conducted due to national recommendations for restrictions regarding the COVID-19 Pandemic (e.g. social distancing).  This format is felt to be most appropriate for this patient at this time.  All issues noted in this document were discussed and addressed.  No physical exam was performed (except for noted visual exam findings with Video Visits).  I discussed the limitations of evaluation and management by telemedicine and the availability of in person appointments. The patient expressed understanding and agreed to proceed.  I connected with@ on 03/04/19 at 10:30 AM EDT by a video enabled telemedicine application and verified that I am speaking with the correct person using two identifiers.   Patient Location: Home [address] [place] [address]   Provider location:   [facility]  Chief Complaint  Patient presents with  . Follow-up    6 wk F/U HTN , meds ok , 1400's/150's/90's100's B.P. ranges     HPI  Donald Russell is a 33 y.o. male who presents via audio/video conferencing for a telehealth visit today.  He Is following up today for HTN and groin rash.  He reports that groin rash has resolved with use of mupirocin ointment.  He denies any pain or swelling.   In regards to his BP he reports that he is doing well with current medication.  Hasn't checked BP in several weeks because he lost his BP cuff.  He reports previous reading 140-150/90.  He states he can tell a big difference if he misses a dose.  He denies chest pain, shortness of breath, palpitations, headache or vision change.     ROS:  A comprehensive ROS was completed and negative except as noted per HPI  Past Medical History:  Diagnosis Date  . Hyperlipidemia   . Hypertension   . Thyroid disease     No past surgical history on file.  Family History  Problem Relation Age of Onset  . Hypertension Mother   .  Hypertension Brother   . Kidney disease Brother   . Alcohol abuse Neg Hx   . Cancer Neg Hx   . Diabetes Neg Hx   . Early death Neg Hx   . Hearing loss Neg Hx   . Heart disease Neg Hx   . Hyperlipidemia Neg Hx   . Stroke Neg Hx     Social History   Socioeconomic History  . Marital status: Single    Spouse name: Not on file  . Number of children: Not on file  . Years of education: Not on file  . Highest education level: Not on file  Occupational History  . Not on file  Social Needs  . Financial resource strain: Not on file  . Food insecurity    Worry: Not on file    Inability: Not on file  . Transportation needs    Medical: Not on file    Non-medical: Not on file  Tobacco Use  . Smoking status: Former Smoker    Packs/day: 0.25    Years: 3.00    Pack years: 0.75    Types: Cigarettes  . Smokeless tobacco: Never Used  Substance and Sexual Activity  . Alcohol use: Yes    Alcohol/week: 3.0 standard drinks    Types: 3 Cans of beer per week  . Drug use: No  . Sexual activity: Yes  Lifestyle  . Physical activity  Days per week: Not on file    Minutes per session: Not on file  . Stress: Not on file  Relationships  . Social Musicianconnections    Talks on phone: Not on file    Gets together: Not on file    Attends religious service: Not on file    Active member of club or organization: Not on file    Attends meetings of clubs or organizations: Not on file    Relationship status: Not on file  . Intimate partner violence    Fear of current or ex partner: Not on file    Emotionally abused: Not on file    Physically abused: Not on file    Forced sexual activity: Not on file  Other Topics Concern  . Not on file  Social History Narrative  . Not on file     Current Outpatient Medications:  .  amLODipine (NORVASC) 5 MG tablet, Take 1 tablet (5 mg total) by mouth daily., Disp: 90 tablet, Rfl: 1  EXAM:  VITALS per patient if applicable: Ht 5' 5.5" (1.664 m)   Wt 196 lb  (88.9 kg)   BMI 32.12 kg/m   GENERAL: alert, oriented, appears well and in no acute distress  HEENT: atraumatic, conjunttiva clear, no obvious abnormalities on inspection of external nose and ears  NECK: normal movements of the head and neck  LUNGS: on inspection no signs of respiratory distress, breathing rate appears normal, no obvious gross SOB, gasping or wheezing  CV: no obvious cyanosis  MS: moves all visible extremities without noticeable abnormality  PSYCH/NEURO: pleasant and cooperative, no obvious depression or anxiety, speech and thought processing grossly intact  ASSESSMENT AND PLAN:  Discussed the following assessment and plan:  Rash -resolved with mupirocin   Essential hypertension -BP previously not well controlled, he will find cuff and check at home.  -I asked him to send me updated readings via mychart, if still running high will plan to increase amlodipine to 10mg  daily.  -Discussed low salt/DASH diet.         I discussed the assessment and treatment plan with the patient. The patient was provided an opportunity to ask questions and all were answered. The patient agreed with the plan and demonstrated an understanding of the instructions.   The patient was advised to call back or seek an in-person evaluation if the symptoms worsen or if the condition fails to improve as anticipated.     Donald Coombeody Mykel Mohl, DO

## 2019-03-04 NOTE — Assessment & Plan Note (Signed)
-  BP previously not well controlled, he will find cuff and check at home.  -I asked him to send me updated readings via mychart, if still running high will plan to increase amlodipine to 10mg  daily.  -Discussed low salt/DASH diet.

## 2019-03-04 NOTE — Assessment & Plan Note (Signed)
-  resolved with mupirocin

## 2019-05-30 ENCOUNTER — Encounter: Payer: Self-pay | Admitting: Family Medicine

## 2019-05-30 MED ORDER — AMLODIPINE BESYLATE 5 MG PO TABS: 5.0000 mg | ORAL_TABLET | Freq: Every day | ORAL | 1 refills | Status: DC

## 2019-10-25 ENCOUNTER — Telehealth: Payer: Self-pay | Admitting: Family Medicine

## 2019-10-25 ENCOUNTER — Ambulatory Visit: Payer: 59 | Admitting: Family Medicine

## 2019-10-25 ENCOUNTER — Other Ambulatory Visit: Payer: Self-pay

## 2019-10-25 NOTE — Telephone Encounter (Signed)
Pt called and wanted to know if he could be seen Monday since he went to the wrong office today for his appointment, The only slot you have is 11:30 but I know that is set for hospital follow ups, Would it be ok to schedule there or should I wait until Monday and if nobody is scheduled can I schedule then.

## 2019-10-25 NOTE — Telephone Encounter (Signed)
Please ignore this note Dr Ashley Royalty, I sent to you by mistake

## 2019-10-25 NOTE — Telephone Encounter (Signed)
Yes that would be okay

## 2019-10-29 ENCOUNTER — Encounter: Payer: 59 | Admitting: Family Medicine

## 2019-10-31 ENCOUNTER — Ambulatory Visit: Payer: 59 | Admitting: Family Medicine

## 2019-12-17 ENCOUNTER — Encounter: Payer: Self-pay | Admitting: Family Medicine

## 2020-01-22 ENCOUNTER — Encounter: Payer: Self-pay | Admitting: Family Medicine

## 2020-01-22 ENCOUNTER — Other Ambulatory Visit: Payer: Self-pay

## 2020-01-22 MED ORDER — AMLODIPINE BESYLATE 5 MG PO TABS: 5.0000 mg | ORAL_TABLET | Freq: Every day | ORAL | 0 refills | Status: DC

## 2020-01-31 ENCOUNTER — Ambulatory Visit: Payer: 59 | Admitting: Family Medicine

## 2020-02-07 ENCOUNTER — Ambulatory Visit: Payer: 59 | Admitting: Family Medicine

## 2020-07-17 ENCOUNTER — Ambulatory Visit (INDEPENDENT_AMBULATORY_CARE_PROVIDER_SITE_OTHER): Payer: 59 | Admitting: Family Medicine

## 2020-07-17 ENCOUNTER — Encounter: Payer: Self-pay | Admitting: Family Medicine

## 2020-07-17 ENCOUNTER — Other Ambulatory Visit: Payer: Self-pay

## 2020-07-17 VITALS — BP 163/101 | HR 89 | Temp 97.9°F | Wt 185.5 lb

## 2020-07-17 DIAGNOSIS — M545 Low back pain, unspecified: Secondary | ICD-10-CM | POA: Diagnosis not present

## 2020-07-17 DIAGNOSIS — I10 Essential (primary) hypertension: Secondary | ICD-10-CM

## 2020-07-17 DIAGNOSIS — M5136 Other intervertebral disc degeneration, lumbar region: Secondary | ICD-10-CM | POA: Insufficient documentation

## 2020-07-17 MED ORDER — AMLODIPINE BESYLATE 5 MG PO TABS: 5.0000 mg | ORAL_TABLET | Freq: Every day | ORAL | 1 refills | Status: DC

## 2020-07-17 MED ORDER — CYCLOBENZAPRINE HCL 10 MG PO TABS: 10.0000 mg | ORAL_TABLET | Freq: Three times a day (TID) | ORAL | 0 refills | Status: DC | PRN

## 2020-07-17 MED ORDER — PREDNISONE 10 MG (21) PO TBPK: ORAL_TABLET | ORAL | 0 refills | Status: DC

## 2020-07-17 NOTE — Patient Instructions (Addendum)
Restart amlodipine  Start prednisone taper.  Flexeril as needed.  Do not take if operating machinery or commercial vehicle.  Try stretches at home.   Have labs completed.     Low Back Sprain or Strain Rehab Ask your health care provider which exercises are safe for you. Do exercises exactly as told by your health care provider and adjust them as directed. It is normal to feel mild stretching, pulling, tightness, or discomfort as you do these exercises. Stop right away if you feel sudden pain or your pain gets worse. Do not begin these exercises until told by your health care provider. Stretching and range-of-motion exercises These exercises warm up your muscles and joints and improve the movement and flexibility of your back. These exercises also help to relieve pain, numbness, and tingling. Lumbar rotation  1. Lie on your back on a firm surface and bend your knees. 2. Straighten your arms out to your sides so each arm forms a 90-degree angle (right angle) with a side of your body. 3. Slowly move (rotate) both of your knees to one side of your body until you feel a stretch in your lower back (lumbar). Try not to let your shoulders lift off the floor. 4. Hold this position for __________ seconds. 5. Tense your abdominal muscles and slowly move your knees back to the starting position. 6. Repeat this exercise on the other side of your body. Repeat __________ times. Complete this exercise __________ times a day. Single knee to chest  1. Lie on your back on a firm surface with both legs straight. 2. Bend one of your knees. Use your hands to move your knee up toward your chest until you feel a gentle stretch in your lower back and buttock. ? Hold your leg in this position by holding on to the front of your knee. ? Keep your other leg as straight as possible. 3. Hold this position for __________ seconds. 4. Slowly return to the starting position. 5. Repeat with your other leg. Repeat  __________ times. Complete this exercise __________ times a day. Prone extension on elbows  1. Lie on your abdomen on a firm surface (prone position). 2. Prop yourself up on your elbows. 3. Use your arms to help lift your chest up until you feel a gentle stretch in your abdomen and your lower back. ? This will place some of your body weight on your elbows. If this is uncomfortable, try stacking pillows under your chest. ? Your hips should stay down, against the surface that you are lying on. Keep your hip and back muscles relaxed. 4. Hold this position for __________ seconds. 5. Slowly relax your upper body and return to the starting position. Repeat __________ times. Complete this exercise __________ times a day. Strengthening exercises These exercises build strength and endurance in your back. Endurance is the ability to use your muscles for a long time, even after they get tired. Pelvic tilt This exercise strengthens the muscles that lie deep in the abdomen. 1. Lie on your back on a firm surface. Bend your knees and keep your feet flat on the floor. 2. Tense your abdominal muscles. Tip your pelvis up toward the ceiling and flatten your lower back into the floor. ? To help with this exercise, you may place a small towel under your lower back and try to push your back into the towel. 3. Hold this position for __________ seconds. 4. Let your muscles relax completely before you repeat this exercise. Repeat  __________ times. Complete this exercise __________ times a day. Alternating arm and leg raises  1. Get on your hands and knees on a firm surface. If you are on a hard floor, you may want to use padding, such as an exercise mat, to cushion your knees. 2. Line up your arms and legs. Your hands should be directly below your shoulders, and your knees should be directly below your hips. 3. Lift your left leg behind you. At the same time, raise your right arm and straighten it in front of  you. ? Do not lift your leg higher than your hip. ? Do not lift your arm higher than your shoulder. ? Keep your abdominal and back muscles tight. ? Keep your hips facing the ground. ? Do not arch your back. ? Keep your balance carefully, and do not hold your breath. 4. Hold this position for __________ seconds. 5. Slowly return to the starting position. 6. Repeat with your right leg and your left arm. Repeat __________ times. Complete this exercise __________ times a day. Abdominal set with straight leg raise  1. Lie on your back on a firm surface. 2. Bend one of your knees and keep your other leg straight. 3. Tense your abdominal muscles and lift your straight leg up, 4-6 inches (10-15 cm) off the ground. 4. Keep your abdominal muscles tight and hold this position for __________ seconds. ? Do not hold your breath. ? Do not arch your back. Keep it flat against the ground. 5. Keep your abdominal muscles tense as you slowly lower your leg back to the starting position. 6. Repeat with your other leg. Repeat __________ times. Complete this exercise __________ times a day. Single leg lower with bent knees 1. Lie on your back on a firm surface. 2. Tense your abdominal muscles and lift your feet off the floor, one foot at a time, so your knees and hips are bent in 90-degree angles (right angles). ? Your knees should be over your hips and your lower legs should be parallel to the floor. 3. Keeping your abdominal muscles tense and your knee bent, slowly lower one of your legs so your toe touches the ground. 4. Lift your leg back up to return to the starting position. ? Do not hold your breath. ? Do not let your back arch. Keep your back flat against the ground. 5. Repeat with your other leg. Repeat __________ times. Complete this exercise __________ times a day. Posture and body mechanics Good posture and healthy body mechanics can help to relieve stress in your body's tissues and joints.  Body mechanics refers to the movements and positions of your body while you do your daily activities. Posture is part of body mechanics. Good posture means:  Your spine is in its natural S-curve position (neutral).  Your shoulders are pulled back slightly.  Your head is not tipped forward. Follow these guidelines to improve your posture and body mechanics in your everyday activities. Standing   When standing, keep your spine neutral and your feet about hip width apart. Keep a slight bend in your knees. Your ears, shoulders, and hips should line up.  When you do a task in which you stand in one place for a long time, place one foot up on a stable object that is 2-4 inches (5-10 cm) high, such as a footstool. This helps keep your spine neutral. Sitting   When sitting, keep your spine neutral and keep your feet flat on the floor. Use  a footrest, if necessary, and keep your thighs parallel to the floor. Avoid rounding your shoulders, and avoid tilting your head forward.  When working at a desk or a computer, keep your desk at a height where your hands are slightly lower than your elbows. Slide your chair under your desk so you are close enough to maintain good posture.  When working at a computer, place your monitor at a height where you are looking straight ahead and you do not have to tilt your head forward or downward to look at the screen. Resting  When lying down and resting, avoid positions that are most painful for you.  If you have pain with activities such as sitting, bending, stooping, or squatting, lie in a position in which your body does not bend very much. For example, avoid curling up on your side with your arms and knees near your chest (fetal position).  If you have pain with activities such as standing for a long time or reaching with your arms, lie with your spine in a neutral position and bend your knees slightly. Try the following positions: ? Lying on your side with a  pillow between your knees. ? Lying on your back with a pillow under your knees. Lifting   When lifting objects, keep your feet at least shoulder width apart and tighten your abdominal muscles.  Bend your knees and hips and keep your spine neutral. It is important to lift using the strength of your legs, not your back. Do not lock your knees straight out.  Always ask for help to lift heavy or awkward objects. This information is not intended to replace advice given to you by your health care provider. Make sure you discuss any questions you have with your health care provider. Document Revised: 12/28/2018 Document Reviewed: 09/27/2018 Elsevier Patient Education  2020 [employer].

## 2020-07-17 NOTE — Progress Notes (Signed)
Donald Russell - 34 y.o. male MRN [id number]  Date of birth: 18-Apr-1986  Subjective Chief Complaint  Patient presents with  . Hypertension    HPI Donald Russell is a 34 y.o. male here today with complaint of low back pain.  He reports that pain started about 1 week ago.  He had increased his weight while working out and started to have R sided low back pain after doing this.  He has some radiation into the R buttock area.  He denies radiation into the leg, numbness, tingling or weakness.  He has been taking 800mg  ibuprofen around the clock since this started to control pain.    BP is high today as well.  He has been out of amlodipine for several months.  He denies symptoms of HTN including chest pain, shortness of breath, palpitations, headache or vision changes.    ROS:  A comprehensive ROS was completed and negative except as noted per HPI  No Known Allergies  Past Medical History:  Diagnosis Date  . Hyperlipidemia   . Hypertension   . Thyroid disease     History reviewed. No pertinent surgical history.  Social History   Socioeconomic History  . Marital status: Single    Spouse name: Not on file  . Number of children: Not on file  . Years of education: Not on file  . Highest education level: Not on file  Occupational History  . Not on file  Tobacco Use  . Smoking status: Former Smoker    Packs/day: 0.25    Years: 3.00    Pack years: 0.75    Types: Cigarettes  . Smokeless tobacco: Never Used  Vaping Use  . Vaping Use: Never used  Substance and Sexual Activity  . Alcohol use: Yes    Alcohol/week: 3.0 standard drinks    Types: 3 Cans of beer per week  . Drug use: No  . Sexual activity: Yes  Other Topics Concern  . Not on file  Social History Narrative  . Not on file   Social Determinants of Health   Financial Resource Strain:   . Difficulty of Paying Living Expenses: Not on file  Food Insecurity:   . Worried About in the Last Year:  Not on file  . Ran Out of Food in the Last Year: Not on file  Transportation Needs:   . Lack of Transportation (Medical): Not on file  . Lack of Transportation (Non-Medical): Not on file  Physical Activity:   . Days of Exercise per Week: Not on file  . Minutes of Exercise per Session: Not on file  Stress:   . Feeling of Stress : Not on file  Social Connections:   . Frequency of Communication with Friends and Family: Not on file  . Frequency of Social Gatherings with Friends and Family: Not on file  . Attends Religious Services: Not on file  . Active Member of Clubs or Organizations: Not on file  . Attends Programme researcher, broadcasting/film/video Meetings: Not on file  . Marital Status: Not on file    Family History  Problem Relation Age of Onset  . Hypertension Mother   . Hypertension Brother   . Kidney disease Brother   . Alcohol abuse Neg Hx   . Cancer Neg Hx   . Diabetes Neg Hx   . Early death Neg Hx   . Hearing loss Neg Hx   . Heart disease Neg Hx   .  Hyperlipidemia Neg Hx   . Stroke Neg Hx     Health Maintenance  Topic Date Due  . Hepatitis C Screening  Never done  . COVID-19 Vaccine (1) Never done  . INFLUENZA VACCINE  Never done  . TETANUS/TDAP  07/05/2028  . HIV Screening  Completed     ----------------------------------------------------------------------------------------------------------------------------------------------------------------------------------------------------------------- Physical Exam BP (!) 163/101 (BP Location: Left Arm, Patient Position: Sitting, Cuff Size: Large)   Pulse 89   Temp 97.9 F (36.6 C)   Wt 185 lb 8 oz (84.1 kg)   SpO2 99%   BMI 30.40 kg/m   Physical Exam Constitutional:      Appearance: Normal appearance.  HENT:     Head: Normocephalic and atraumatic.  Cardiovascular:     Rate and Rhythm: Normal rate and regular rhythm.  Pulmonary:     Effort: Pulmonary effort is normal.     Breath sounds: Normal breath sounds.   Musculoskeletal:     Comments: Low back normal to inspection and palpation.  No step off palpated.  SLR negative.  Pain with FABER test.  Normal strength throughout RLE.    Neurological:     General: No focal deficit present.     Mental Status: He is alert.  Psychiatric:        Mood and Affect: Mood normal.        Behavior: Behavior normal.     ------------------------------------------------------------------------------------------------------------------------------------------------------------------------------------------------------------------- Assessment and Plan  Essential hypertension Blood pressure is not at goal at for age and co-morbidities.  I recommend that he restart amlodipine 5mg  daily, updated rx sent in.  In addition they were instructed to follow a low sodium diet with regular exercise to help to maintain adequate control of blood pressure.    Low back pain Low back pain without significant radicular symptoms.  Start prednisone taper with flexeril as needed.  Given handout for low back rehab program.  F/u in 3-4 weeks.    Meds ordered this encounter  Medications  . predniSONE (STERAPRED UNI-PAK 21 TAB) 10 MG (21) TBPK tablet    Sig: Taper as directed on packaging.    Dispense:  21 tablet    Refill:  0  . cyclobenzaprine (FLEXERIL) 10 MG tablet    Sig: Take 1 tablet (10 mg total) by mouth 3 (three) times daily as needed for muscle spasms.    Dispense:  30 tablet    Refill:  0  . amLODipine (NORVASC) 5 MG tablet    Sig: Take 1 tablet (5 mg total) by mouth daily.    Dispense:  90 tablet    Refill:  1    Return in about 3 weeks (around 08/07/2020) for HTN.    This visit occurred during the SARS-CoV-2 public health emergency.  Safety protocols were in place, including screening questions prior to the visit, additional usage of staff PPE, and extensive cleaning of exam room while observing appropriate contact time as indicated for disinfecting  solutions.

## 2020-07-17 NOTE — Assessment & Plan Note (Signed)
Blood pressure is not at goal at for age and co-morbidities.  I recommend that he restart amlodipine 5mg  daily, updated rx sent in.  In addition they were instructed to follow a low sodium diet with regular exercise to help to maintain adequate control of blood pressure.

## 2020-07-17 NOTE — Assessment & Plan Note (Signed)
Low back pain without significant radicular symptoms.  Start prednisone taper with flexeril as needed.  Given handout for low back rehab program.  F/u in 3-4 weeks.

## 2020-07-18 LAB — COMPLETE METABOLIC PANEL WITH GFR
AG Ratio: 2 (calc) (ref 1.0–2.5)
ALT: 29 U/L (ref 9–46)
AST: 23 U/L (ref 10–40)
Albumin: 4.9 g/dL (ref 3.6–5.1)
Alkaline phosphatase (APISO): 48 U/L (ref 36–130)
BUN: 15 mg/dL (ref 7–25)
CO2: 26 mmol/L (ref 20–32)
Calcium: 10.2 mg/dL (ref 8.6–10.3)
Chloride: 103 mmol/L (ref 98–110)
Creat: 1.04 mg/dL (ref 0.60–1.35)
GFR, Est African American: 110 mL/min/{1.73_m2} (ref >=60)
GFR, Est Non African American: 95 mL/min/{1.73_m2} (ref >=60)
Globulin: 2.5 g/dL (calc) (ref 1.9–3.7)
Glucose, Bld: 80 mg/dL (ref 65–139)
Potassium: 4.4 mmol/L (ref 3.5–5.3)
Sodium: 139 mmol/L (ref 135–146)
Total Bilirubin: 1 mg/dL (ref 0.2–1.2)
Total Protein: 7.4 g/dL (ref 6.1–8.1)

## 2020-07-18 LAB — CBC
HCT: 43.9 % (ref 38.5–50.0)
Hemoglobin: 15.4 g/dL (ref 13.2–17.1)
MCH: 32.8 pg (ref 27.0–33.0)
MCHC: 35.1 g/dL (ref 32.0–36.0)
MCV: 93.6 fL (ref 80.0–100.0)
MPV: 11.8 fL (ref 7.5–12.5)
Platelets: 232 10*3/uL (ref 140–400)
RBC: 4.69 10*6/uL (ref 4.20–5.80)
RDW: 12 % (ref 11.0–15.0)
WBC: 4 10*3/uL (ref 3.8–10.8)

## 2020-09-16 ENCOUNTER — Encounter: Payer: Self-pay | Admitting: Medical-Surgical

## 2020-09-16 ENCOUNTER — Telehealth (INDEPENDENT_AMBULATORY_CARE_PROVIDER_SITE_OTHER): Payer: 59 | Admitting: Medical-Surgical

## 2020-09-16 DIAGNOSIS — F321 Major depressive disorder, single episode, moderate: Secondary | ICD-10-CM | POA: Diagnosis not present

## 2020-09-16 DIAGNOSIS — F419 Anxiety disorder, unspecified: Secondary | ICD-10-CM

## 2020-09-16 MED ORDER — ESCITALOPRAM OXALATE 10 MG PO TABS: ORAL_TABLET | ORAL | 1 refills | Status: DC

## 2020-09-16 MED ORDER — HYDROXYZINE HCL 25 MG PO TABS: 25.0000 mg | ORAL_TABLET | Freq: Three times a day (TID) | ORAL | 0 refills | Status: DC | PRN

## 2020-09-16 NOTE — Progress Notes (Signed)
Virtual Visit via Video Note  I connected with Donald Russell on 09/16/20 at  2:00 PM EST by a video enabled telemedicine application and verified that I am speaking with the correct person using two identifiers.   I discussed the limitations of evaluation and management by telemedicine and the availability of in person appointments. The patient expressed understanding and agreed to proceed.  Patient location: home Provider locations: office  Subjective:    CC: anxiety/depression  HPI: Pleasant 34 year old male presenting today via MyChart video visit to discuss anxiety and depression.  He has struggled with the symptoms in the past but has been resistant to starting medications.  Unfortunately, he has had some significant life changes with the resolution of his 10-year relationship.  Notes that there is quite a bit of upheaval and there are many questions regarding ownership of the home that was brought together as well as custody of the children.  Notes that he is having difficulty functioning on a daily basis.  Has been oversleeping, having difficulty concentrating.  Also notes that his eating habits have changed with some days having no appetite and others overeating.  Is now open to starting medication to help with symptoms.  Denies SI/HI.  Past medical history, Surgical history, Family history not pertinant except as noted below, Social history, Allergies, and medications have been entered into the medical record, reviewed, and corrections made.   Review of Systems: See HPI for pertinent positives and negatives.   Depression screen [facility] 2/9 09/16/2020 07/05/2018  Decreased Interest 1 0  Down, Depressed, Hopeless 3 0  PHQ - 2 Score 4 0  Altered sleeping 1 -  Tired, decreased energy 1 -  Change in appetite 1 -  Feeling bad or failure about yourself  1 -  Trouble concentrating 3 -  Moving slowly or fidgety/restless 1 -  Suicidal thoughts 0 -  PHQ-9 Score 12 -   GAD 7 :  Generalized Anxiety Score 09/16/2020  Nervous, Anxious, on Edge 1  Control/stop worrying 3  Worry too much - different things 1  Trouble relaxing 3  Restless 1  Easily annoyed or irritable 1  Afraid - awful might happen 2  Total GAD 7 Score 12  Anxiety Difficulty Somewhat difficult   Objective:    General: Speaking clearly in complete sentences without any shortness of breath.  Alert and oriented x3.  Normal judgment. No apparent acute distress.  Impression and Recommendations:    1. MDD (major depressive disorder), single episode, moderate (HCC)/anxiety Starting Lexapro 5 mg x 8 days then increase to 10 mg daily.  Discussed expectations for effectiveness of this medication as it will take several weeks to notice a difference.  In the meantime sending in hydroxyzine 25-50 mg 3 times daily as needed for anxiety.  Patient that hydroxyzine will cause drowsiness so we will make sure he is not driving, operating machinery, or at work when taking it until he knows how this medication affects him.  I discussed the assessment and treatment plan with the patient. The patient was provided an opportunity to ask questions and all were answered. The patient agreed with the plan and demonstrated an understanding of the instructions.   The patient was advised to call back or seek an in-person evaluation if the symptoms worsen or if the condition fails to improve as anticipated.  20 minutes of non-face-to-face time was provided during this encounter.  Return for Mood follow-up in 3-4 weeks with PCP.  Thayer Ohm,  DNP, APRN, FNP-BC [facility]

## 2020-10-07 ENCOUNTER — Other Ambulatory Visit: Payer: Self-pay | Admitting: Medical-Surgical

## 2020-10-08 ENCOUNTER — Telehealth (INDEPENDENT_AMBULATORY_CARE_PROVIDER_SITE_OTHER): Payer: 59 | Admitting: Medical-Surgical

## 2020-10-08 ENCOUNTER — Encounter: Payer: Self-pay | Admitting: Medical-Surgical

## 2020-10-08 DIAGNOSIS — F411 Generalized anxiety disorder: Secondary | ICD-10-CM | POA: Diagnosis not present

## 2020-10-08 DIAGNOSIS — U071 COVID-19: Secondary | ICD-10-CM

## 2020-10-08 DIAGNOSIS — F322 Major depressive disorder, single episode, severe without psychotic features: Secondary | ICD-10-CM | POA: Insufficient documentation

## 2020-10-08 MED ORDER — HYDROXYZINE HCL 25 MG PO TABS: 25.0000 mg | ORAL_TABLET | Freq: Three times a day (TID) | ORAL | 0 refills | Status: DC | PRN

## 2020-10-08 NOTE — Progress Notes (Signed)
Virtual Visit via Telephone   I connected with  Donald Russell Vessels  on 10/08/20 by telephone/telehealth and verified that I am speaking with the correct person using two identifiers.   I discussed the limitations, risks, security and privacy concerns of performing an evaluation and management service by telephone, including the higher likelihood of inaccurate diagnosis and treatment, and the availability of in person appointments.  We also discussed the likely need of an additional face to face encounter for complete and high quality delivery of care.  I also discussed with the patient that there may be a patient responsible charge related to this service. The patient expressed understanding and wishes to proceed.  Provider location is in medical facility. Patient location is at their home, different from provider location. People involved in care of the patient during this telehealth encounter were myself, my nurse/medical assistant, and my front office/scheduling team member.  CC: Anxiety, COVID-positive  HPI: Pleasant 35 year old male presenting via telephone visit to discuss anxiety and COVID-19.  We were able to connect using video capabilities but he was unable to hear me at that time.  Due to the sound difficulties, the visit was converted to a telephone visit.  He is accompanied by his mother who is present on speaker phone at his and and helps serve as historian.  Anxiety-was seen for anxiety and depression by myself on 09/16/2020 and prescribed to start Lexapro and use hydroxyzine 3 times daily as needed for anxiety.  He admits that he did not start either of the medications and did not pick up his prescriptions.  Yesterday, he was involved in the conversation with his ex and was not coping well with the fact that the relationship is truly over.  Notes that he had what he terms as a panic attack where he was significantly short of breath with chest pain and palpitations.  He has been having  anxiety attacks for a few months according to his mother's report.  He is severely depressed and is not eating well.  Notes that all he wants to do is sleep.  He is not doing any counseling.  Denies SI/HI.  COVID-19 positive-tested positive approximately 2 weeks ago and has had a slow recovery so far.  He is still dealing with congestion and cough but is slowly recovering.  He was not vaccinated.  Review of Systems: See HPI for pertinent positives and negatives.   Objective Findings:    General: Speaking full sentences, no audible heavy breathing.  Sounds alert and appropriately interactive.    Impression and Recommendations:    1. COVID-19 virus infection Discussed self-care and the expectation for resolution viral symptoms.  As he was unvaccinated, suspect he will take another week or 2 to completely clear his COVID-19 symptoms.  Advised to make sure to eat and drink regularly to stay hydrated contain strength.  Rest when needed and use over-the-counter medications to help with cough suppression and reduce congestion.  2. Anxiety state/Depression, major, single episode, severe (HCC) Discussed the situation with the patient and his mother.  We did prescribe medications at our last visit just before the new year that are appropriate for his symptoms.  Strongly advised getting these from the pharmacy as they are still available and starting Lexapro every day.  Also recommend getting hydroxyzine for use as needed.  Urgent counselor needed so interim behavioral health referral.  I will go ahead and write a letter to keep him out of work for the next 2  weeks since he is currently not able to function as needed on his job.  They will send over any FMLA paperwork that may be required.  For follow-up, strongly recommend following up with Dr. Ashley Royalty as it is for him to have his PCP involved in these decisions.  Reviewed urgent behavioral health resources.  Psychological emergency information sent to  patient via MyChart.  Work note sent via [employer].  I discussed the above assessment and treatment plan with the patient. The patient was provided an opportunity to ask questions and all were answered. The patient agreed with the plan and demonstrated an understanding of the instructions.   The patient was advised to call back or seek an in-person evaluation if the symptoms worsen or if the condition fails to improve as anticipated.  25 minutes of non-face-to-face time was provided during this encounter.  Return in about 2 weeks (around 10/22/2020) for mood follow up.   ___________________________________________ Christen Butter, DNP, APRN, FNP-BC [facility]

## 2020-10-09 ENCOUNTER — Encounter: Payer: Self-pay | Admitting: Medical-Surgical

## 2020-10-14 ENCOUNTER — Telehealth (INDEPENDENT_AMBULATORY_CARE_PROVIDER_SITE_OTHER): Payer: 59 | Admitting: Family Medicine

## 2020-10-14 ENCOUNTER — Encounter: Payer: Self-pay | Admitting: Family Medicine

## 2020-10-14 VITALS — Temp 98.7°F | Wt 174.0 lb

## 2020-10-14 DIAGNOSIS — F411 Generalized anxiety disorder: Secondary | ICD-10-CM

## 2020-10-14 DIAGNOSIS — F322 Major depressive disorder, single episode, severe without psychotic features: Secondary | ICD-10-CM

## 2020-10-14 MED ORDER — ESCITALOPRAM OXALATE 10 MG PO TABS: ORAL_TABLET | ORAL | 1 refills | Status: DC

## 2020-10-15 ENCOUNTER — Encounter: Payer: Self-pay | Admitting: Family Medicine

## 2020-10-15 MED ORDER — HYDROXYZINE HCL 25 MG PO TABS: 25.0000 mg | ORAL_TABLET | Freq: Three times a day (TID) | ORAL | 0 refills | Status: DC | PRN

## 2020-10-15 NOTE — Progress Notes (Signed)
Donald Russell - 35 y.o. male MRN [id number]  Date of birth: July 20, 1986   This visit type was conducted due to national recommendations for restrictions regarding the COVID-19 Pandemic (e.g. social distancing).  This format is felt to be most appropriate for this patient at this time.  All issues noted in this document were discussed and addressed.  No physical exam was performed (except for noted visual exam findings with Video Visits).  I discussed the limitations of evaluation and management by telemedicine and the availability of in person appointments. The patient expressed understanding and agreed to proceed.  I connected with@ on 10/15/20 at  2:40 PM EST by a video enabled telemedicine application and verified that I am speaking with the correct person using two identifiers.  Present at visit: Everrett Coombe, DO Rodel D Ulice Brilliant   Patient Location: Home [address] [place] [address]   Provider location:   [facility]  No chief complaint on file.   HPI  Donald Russell is a 35 y.o. male who presents via Web designer for a telehealth visit today.  He is following up today for depression.  He was seen via VV with Joy on 12/29.  Started on lexapro and hydroxyzine.  He didn't start the lexapro until about 2 weeks ago.  He is taking hydroxyzine at night but makes him too drowsy during the day.  He still feels diminished energy levels, altered sleep, and difficulty with concentration.  Continues to have stress regarding end of his relationship as well as custody of the children and ownership of his home.  Joy had written him out of work from 10/07/20-10/21/2020.  He needs updated FMLA paperwork completed.     ROS:  A comprehensive ROS was completed and negative except as noted per HPI  Past Medical History:  Diagnosis Date  . Hyperlipidemia   . Hypertension   . Thyroid disease     No past surgical history on file.  Family History  Problem Relation Age of Onset  .  Hypertension Mother   . Hypertension Brother   . Kidney disease Brother   . Alcohol abuse Neg Hx   . Cancer Neg Hx   . Diabetes Neg Hx   . Early death Neg Hx   . Hearing loss Neg Hx   . Heart disease Neg Hx   . Hyperlipidemia Neg Hx   . Stroke Neg Hx     Social History   Socioeconomic History  . Marital status: Single    Spouse name: Not on file  . Number of children: Not on file  . Years of education: Not on file  . Highest education level: Not on file  Occupational History  . Not on file  Tobacco Use  . Smoking status: Former Smoker    Packs/day: 0.25    Years: 3.00    Pack years: 0.75    Types: Cigarettes  . Smokeless tobacco: Never Used  Vaping Use  . Vaping Use: Never used  Substance and Sexual Activity  . Alcohol use: Yes    Alcohol/week: 3.0 standard drinks    Types: 3 Cans of beer per week  . Drug use: No  . Sexual activity: Yes  Other Topics Concern  . Not on file  Social History Narrative  . Not on file   Social Determinants of Health   Financial Resource Strain: Not on file  Food Insecurity: Not on file  Transportation Needs: Not on file  Physical Activity:  Not on file  Stress: Not on file  Social Connections: Not on file  Intimate Partner Violence: Not on file     Current Outpatient Medications:  .  amLODipine (NORVASC) 5 MG tablet, Take 1 tablet (5 mg total) by mouth daily., Disp: 90 tablet, Rfl: 1 .  cyclobenzaprine (FLEXERIL) 10 MG tablet, Take 1 tablet (10 mg total) by mouth 3 (three) times daily as needed for muscle spasms., Disp: 30 tablet, Rfl: 0 .  hydrOXYzine (ATARAX/VISTARIL) 25 MG tablet, Take 1-2 tablets (25-50 mg total) by mouth 3 (three) times daily as needed. Start with 1 tab and if not effective, try 2 tabs. Will cause drowsiness., Disp: 60 tablet, Rfl: 0 .  predniSONE (STERAPRED UNI-PAK 21 TAB) 10 MG (21) TBPK tablet, Taper as directed on packaging., Disp: 21 tablet, Rfl: 0 .  escitalopram (LEXAPRO) 10 MG tablet, Take 1/2 tablet  (5mg ) daily x 8 days then increase to 1 tablet (10mg ) daily., Disp: 30 tablet, Rfl: 1  EXAM:  VITALS per patient if applicable: Temp 98.7 F (37.1 C)   Wt 174 lb (78.9 kg)   BMI 28.51 kg/m   GENERAL: alert, oriented, appears well and in no acute distress  HEENT: atraumatic, conjunttiva clear, no obvious abnormalities on inspection of external nose and ears  NECK: normal movements of the head and neck  LUNGS: on inspection no signs of respiratory distress, breathing rate appears normal, no obvious gross SOB, gasping or wheezing  CV: no obvious cyanosis  MS: moves all visible extremities without noticeable abnormality  PSYCH/NEURO: pleasant and cooperative,  Slowed speech and depressed affect.   ASSESSMENT AND PLAN:  Discussed the following assessment and plan:  Depression, major, single episode, severe (HCC) Slight improvement since starting lexapro however hasn't really been taking long enough to see full benefits of medication.  Will continue at current strength at this time.  He will also continue hydroxyzine as needed, currently using only at night.  followup in about 4 weeks.      I discussed the assessment and treatment plan with the patient. The patient was provided an opportunity to ask questions and all were answered. The patient agreed with the plan and demonstrated an understanding of the instructions.   The patient was advised to call back or seek an in-person evaluation if the symptoms worsen or if the condition fails to improve as anticipated.    , DO

## 2020-10-15 NOTE — Assessment & Plan Note (Signed)
Slight improvement since starting lexapro however hasn't really been taking long enough to see full benefits of medication.  Will continue at current strength at this time.  He will also continue hydroxyzine as needed, currently using only at night.  followup in about 4 weeks.

## 2020-10-20 ENCOUNTER — Encounter: Payer: Self-pay | Admitting: Family Medicine

## 2020-10-21 ENCOUNTER — Telehealth: Payer: Self-pay

## 2020-10-21 NOTE — Telephone Encounter (Signed)
Patient called stating he has not received his work note yet. Wants time off to be extended due to his condition.

## 2020-10-21 NOTE — Telephone Encounter (Signed)
See previous mychart  messages.  

## 2020-11-23 ENCOUNTER — Encounter: Payer: Self-pay | Admitting: Family Medicine

## 2020-11-23 ENCOUNTER — Other Ambulatory Visit: Payer: Self-pay | Admitting: Family Medicine

## 2020-11-23 DIAGNOSIS — F322 Major depressive disorder, single episode, severe without psychotic features: Secondary | ICD-10-CM

## 2020-11-23 DIAGNOSIS — F411 Generalized anxiety disorder: Secondary | ICD-10-CM

## 2020-11-23 MED ORDER — AMLODIPINE BESYLATE 5 MG PO TABS: 5.0000 mg | ORAL_TABLET | Freq: Every day | ORAL | 1 refills | Status: DC

## 2020-12-25 ENCOUNTER — Ambulatory Visit (INDEPENDENT_AMBULATORY_CARE_PROVIDER_SITE_OTHER): Payer: 59 | Admitting: Psychologist

## 2020-12-25 DIAGNOSIS — F320 Major depressive disorder, single episode, mild: Secondary | ICD-10-CM | POA: Diagnosis not present

## 2021-01-08 ENCOUNTER — Ambulatory Visit: Payer: 59 | Admitting: Psychologist

## 2021-02-04 ENCOUNTER — Other Ambulatory Visit: Payer: Self-pay

## 2021-02-04 ENCOUNTER — Emergency Department (INDEPENDENT_AMBULATORY_CARE_PROVIDER_SITE_OTHER)
Admission: EM | Admit: 2021-02-04 | Discharge: 2021-02-04 | Disposition: A | Discharge status: Home / Self Care | Payer: 59 | Source: Home / Self Care

## 2021-02-04 ENCOUNTER — Encounter: Payer: Self-pay | Admitting: Emergency Medicine

## 2021-02-04 DIAGNOSIS — F32A Depression, unspecified: Secondary | ICD-10-CM

## 2021-02-04 MED ORDER — BUPROPION HCL ER (SR) 150 MG PO TB12: ORAL_TABLET | ORAL | 1 refills | Status: DC

## 2021-02-04 NOTE — Discharge Instructions (Addendum)
Instructed/strongly encouraged patient to follow-up with his PCP within the next 2 weeks for office visit for referral to either psychiatry or counseling.  Advised/encouraged patient to please take advantage of Behavioral Health Urgent Care Services in [place], [place] if there is a delay getting in with PCP.  Advised/encouraged patient to take medication as directed daily and to please allow 3 to 4 weeks to reach pharmacotherapeutic efficacy.  Advised patient if he have thoughts of hurting himself or others please go to nearest emergency room for further evaluation.

## 2021-02-04 NOTE — ED Triage Notes (Signed)
Patient states that he has lost a lot in the last couple of months.  There are some days that he doesn't feel like going.  Patient does have a history blood pressure.  Patient does have medication for anxiety/depression.  When he talks he can't keep himself together.

## 2021-02-04 NOTE — ED Provider Notes (Signed)
[facility]    CSN: [id number] Arrival date & time: 02/04/21  1407      History   Chief Complaint Chief Complaint  Patient presents with  . Depression    HPI Donald Russell is a 35 y.o. male.   HPI   35 year old male presents with depression patient reports has lost a lot over the last 2 months. Patient reports significant other has taken his house and his children. Patient  reports some days he does not feel like going out to do anything. Reports had extreme difficulty concentrating on tasks while at work.  PMH significant for depression, major, single episode, severe and anxiety state.  Patient reports discontinuing/stopping Escitalopram 10 mg daily 2-3 weeks ago abruptly reports losing his medicine and having no refills or contacting PCP for refills.  Patient reports Hydroxyzine 25 mg (2-3) times daily caused his heart to race and he just continued that medication shortly after starting. He is followed by [facility] PCP and had office visit with him roughly 6 weeks ago.  Denies thoughts, plans, or intentions of harming self or others.  Patient request work note to excuse him for 1 week to have time to work things out and get established with therapist [facility] in [place] and to follow-up with his [facility] PCP for office visit.  Past Medical History:  Diagnosis Date  . Hyperlipidemia   . Hypertension   . Thyroid disease     Patient Active Problem List   Diagnosis Date Noted  . Depression, major, single episode, severe (HCC) 10/08/2020  . Anxiety state 10/08/2020  . Low back pain 07/17/2020  . Essential hypertension 08/10/2012  . Pure hypercholesterolemia 08/10/2012    History reviewed. No pertinent surgical history.     Home Medications    Prior to Admission medications   Medication Sig Start Date End Date Taking? Authorizing Provider  amLODipine (NORVASC) 5 MG tablet Take 1 tablet (5 mg total) by mouth daily. 11/23/20  Yes  Everrett Coombe, DO  buPROPion [facility] SR) 150 MG 12 hr tablet Take 1 tab PO daily x 7 days, then on day 8 take 2 tabs PO daily 02/04/21  Yes Trevor Iha, FNP  escitalopram (LEXAPRO) 10 MG tablet Take 1/2 tablet (5mg ) daily x 8 days then increase to 1 tablet (10mg ) daily. 10/14/20 02/04/21 Yes 10/16/20, DO  cyclobenzaprine (FLEXERIL) 10 MG tablet Take 1 tablet (10 mg total) by mouth 3 (three) times daily as needed for muscle spasms. 07/17/20   Everrett Coombe, DO  predniSONE (STERAPRED UNI-PAK 21 TAB) 10 MG (21) TBPK tablet Taper as directed on packaging. 07/17/20   Everrett Coombe, DO    Family History Family History  Problem Relation Age of Onset  . Hypertension Mother   . Hypertension Brother   . Kidney disease Brother   . Alcohol abuse Neg Hx   . Cancer Neg Hx   . Diabetes Neg Hx   . Early death Neg Hx   . Hearing loss Neg Hx   . Heart disease Neg Hx   . Hyperlipidemia Neg Hx   . Stroke Neg Hx     Social History Social History   Tobacco Use  . Smoking status: Former Smoker    Packs/day: 0.25    Years: 3.00    Pack years: 0.75    Types: Cigarettes  . Smokeless tobacco: Never Used  Vaping Use  . Vaping Use: Never used  Substance Use Topics  . Alcohol  use: Yes    Alcohol/week: 3.0 standard drinks    Types: 3 Cans of beer per week  . Drug use: No     Allergies   Patient has no known allergies.   Review of Systems Review of Systems  Constitutional: Negative.   HENT: Negative.   Eyes: Negative.   Respiratory: Negative.   Cardiovascular: Negative.   Gastrointestinal: Negative.   Genitourinary: Negative.   Musculoskeletal: Negative.   Skin: Negative.   Neurological: Negative.   Psychiatric/Behavioral: Positive for decreased concentration.       Depression     Physical Exam Triage Vital Signs ED Triage Vitals  Enc Vitals Group     BP 02/04/21 1507 (!) 182/100     Pulse Rate 02/04/21 1507 (!) 104     Resp --      Temp 02/04/21 1507 98.3 F (36.8  C)     Temp Source 02/04/21 1507 Oral     SpO2 02/04/21 1507 94 %     Weight --      Height --      Head Circumference --      Peak Flow --      Pain Score 02/04/21 1509 0     Pain Loc --      Pain Edu? --      Excl. in GC? --    No data found.  Updated Vital Signs BP (!) 148/88 (BP Location: Right Arm)   Pulse (!) 104   Temp 98.3 F (36.8 C) (Oral)   SpO2 94%      Physical Exam Vitals and nursing note reviewed.  Constitutional:      General: He is not in acute distress.    Appearance: Normal appearance. He is normal weight. He is not ill-appearing.     Comments: Tearful and crying during exam today  HENT:     Head: Normocephalic and atraumatic.     Mouth/Throat:     Mouth: Mucous membranes are moist.     Pharynx: Oropharynx is clear.  Eyes:     Extraocular Movements: Extraocular movements intact.     Conjunctiva/sclera: Conjunctivae normal.     Pupils: Pupils are equal, round, and reactive to light.  Cardiovascular:     Rate and Rhythm: Normal rate and regular rhythm.     Pulses: Normal pulses.     Heart sounds: Normal heart sounds. No murmur heard.   Pulmonary:     Effort: Pulmonary effort is normal.     Breath sounds: Rhonchi present. No wheezing or rales.  Musculoskeletal:        General: Normal range of motion.  Skin:    General: Skin is warm and dry.  Neurological:     General: No focal deficit present.     Mental Status: He is alert and oriented to person, place, and time.  Psychiatric:        Behavior: Behavior normal.        Thought Content: Thought content normal.     Comments: Depressed mood, upset affect, tearful and crying throughout exam      UC Treatments / Results  Labs (all labs ordered are listed, but only abnormal results are displayed) Labs Reviewed - No data to display  EKG   Radiology No results found.  Procedures Procedures (including critical care time)  Medications Ordered in UC Medications - No data to  display  Initial Impression / Assessment and Plan / UC Course  I have reviewed the triage vital  signs and the nursing notes.  Pertinent labs & imaging results that were available during my care of the patient were reviewed by me and considered in my medical decision making (see chart for details).     MDM: 1.  Depression Final Clinical Impressions(s) / UC Diagnoses   Final diagnoses:  Depression, unspecified depression type     Discharge Instructions     Instructed/strongly encouraged patient to follow-up with his PCP within the next 2 weeks for office visit for referral to either psychiatry or counseling.  Advised/encouraged patient to please take advantage of [facility] in [place], [place] if there is a delay getting in with PCP.  Advised/encouraged patient to take medication as directed daily and to please allow 3 to 4 weeks to reach pharmacotherapeutic efficacy.  Advised patient if he have thoughts of hurting himself or others please go to nearest emergency room for further evaluation.    ED Prescriptions    Medication Sig Dispense Auth. Provider   buPROPion (WELLBUTRIN SR) 150 MG 12 hr tablet Take 1 tab PO daily x 7 days, then on day 8 take 2 tabs PO daily 60 tablet Trevor Iha, FNP     PDMP not reviewed this encounter.   Trevor Iha, FNP 02/05/21 [phone]

## 2021-02-15 ENCOUNTER — Encounter: Payer: Self-pay | Admitting: Family Medicine

## 2021-02-16 ENCOUNTER — Other Ambulatory Visit: Payer: Self-pay | Admitting: *Deleted

## 2021-12-20 ENCOUNTER — Emergency Department
Admission: EM | Admit: 2021-12-20 | Discharge: 2021-12-20 | Disposition: A | Discharge status: Home / Self Care | Payer: 59 | Source: Home / Self Care

## 2021-12-20 ENCOUNTER — Encounter: Payer: Self-pay | Admitting: Family Medicine

## 2021-12-20 DIAGNOSIS — L282 Other prurigo: Secondary | ICD-10-CM

## 2021-12-20 MED ORDER — METHYLPREDNISOLONE 4 MG PO TBPK: ORAL_TABLET | ORAL | 0 refills | Status: DC

## 2021-12-20 NOTE — Discharge Instructions (Addendum)
Advised patient if skin becomes irritated or rash becomes visible may start Medrol Dosepak.  Advised patient if starting medication and take to completion with food.  Advised patient if rash worsens and/or unknown resolved please follow-up with PCP or here for further evaluation.  ?

## 2021-12-20 NOTE — ED Triage Notes (Signed)
Pt presents to Urgent Care with c/o skin irritation to perineal area after having intercourse w/ new partner 4 days ago. Denies dysuria and discharge.  ?

## 2021-12-20 NOTE — ED Provider Notes (Signed)
?[facility] ? ? ? ?CSN: [id number] ?Arrival date & time: 12/20/21  1716 ? ? ?  ? ?History   ?Chief Complaint ?Chief Complaint  ?Patient presents with  ? skin irritation  ? ? ?HPI ?Donald Russell is a 36 y.o. male.  ? ?HPI 36 year old male presents with skin irritation to perineal area after his having intercourse with a new partner 4 days ago.  Denies dysuria or discharge.  PMH significant for HTN, HLD, and thyroid disease. ? ?Past Medical History:  ?Diagnosis Date  ? Hyperlipidemia   ? Hypertension   ? Thyroid disease   ? ? ?Patient Active Problem List  ? Diagnosis Date Noted  ? Depression, major, single episode, severe (HCC) 10/08/2020  ? Anxiety state 10/08/2020  ? Low back pain 07/17/2020  ? Essential hypertension 08/10/2012  ? Pure hypercholesterolemia 08/10/2012  ? ? ?History reviewed. No pertinent surgical history. ? ? ? ? ?Home Medications   ? ?Prior to Admission medications   ?Medication Sig Start Date End Date Taking? Authorizing Provider  ?methylPREDNISolone (MEDROL DOSEPAK) 4 MG TBPK tablet Take as directed 12/20/21  Yes Trevor Iha, FNP  ?amLODipine (NORVASC) 5 MG tablet Take 1 tablet (5 mg total) by mouth daily. 11/23/20   Everrett Coombe, DO  ?buPROPion [facility] SR) 150 MG 12 hr tablet Take 1 tab PO daily x 7 days, then on day 8 take 2 tabs PO daily 02/04/21   Trevor Iha, FNP  ?escitalopram (LEXAPRO) 10 MG tablet Take 1/2 tablet (5mg ) daily x 8 days then increase to 1 tablet (10mg ) daily. 10/14/20 02/04/21  10/16/20, DO  ? ? ?Family History ?Family History  ?Problem Relation Age of Onset  ? Hypertension Mother   ? Hypertension Brother   ? Kidney disease Brother   ? Alcohol abuse Neg Hx   ? Cancer Neg Hx   ? Diabetes Neg Hx   ? Early death Neg Hx   ? Hearing loss Neg Hx   ? Heart disease Neg Hx   ? Hyperlipidemia Neg Hx   ? Stroke Neg Hx   ? ? ?Social History ?Social History  ? ?Tobacco Use  ? Smoking status: Former  ?  Packs/day: 0.25  ?  Years: 3.00  ?  Pack years: 0.75  ?   Types: Cigarettes  ? Smokeless tobacco: Never  ?Vaping Use  ? Vaping Use: Never used  ?Substance Use Topics  ? Alcohol use: Yes  ?  Alcohol/week: 7.0 standard drinks  ?  Types: 7 Cans of beer per week  ?  Comment: weekly  ? Drug use: No  ? ? ? ?Allergies   ?Patient has no known allergies. ? ? ?Review of Systems ?Review of Systems  ?Skin:  Positive for rash.  ? ? ?Physical Exam ?Triage Vital Signs ?ED Triage Vitals  ?Enc Vitals Group  ?   BP 12/20/21 1800 (!) 183/108  ?   Pulse Rate 12/20/21 1800 93  ?   Resp 12/20/21 1800 20  ?   Temp 12/20/21 1800 98.4 ?F (36.9 ?C)  ?   Temp Source 12/20/21 1800 Oral  ?   SpO2 12/20/21 1800 98 %  ?   Weight 12/20/21 1755 200 lb (90.7 kg)  ?   Height 12/20/21 1755 5\' 6"  (1.676 m)  ?   Head Circumference --   ?   Peak Flow --   ?   Pain Score 12/20/21 1755 0  ?   Pain Loc --   ?  Pain Edu? --   ?   Excl. in GC? --   ? ?No data found. ? ?Updated Vital Signs ?BP (!) 170/108 (BP Location: Left Arm)   Pulse 93   Temp 98.4 ?F (36.9 ?C) (Oral)   Resp 20   Ht 5\' 6"  (1.676 m)   Wt 200 lb (90.7 kg)   SpO2 98%   BMI 32.28 kg/m?  ? ? ?Physical Exam ?Vitals and nursing note reviewed.  ?Constitutional:   ?   Appearance: Normal appearance. He is normal weight.  ?HENT:  ?   Head: Normocephalic and atraumatic.  ?   Mouth/Throat:  ?   Mouth: Mucous membranes are moist.  ?   Pharynx: Oropharynx is clear.  ?Eyes:  ?   Extraocular Movements: Extraocular movements intact.  ?   Conjunctiva/sclera: Conjunctivae normal.  ?   Pupils: Pupils are equal, round, and reactive to light.  ?Cardiovascular:  ?   Rate and Rhythm: Normal rate and regular rhythm.  ?   Pulses: Normal pulses.  ?   Heart sounds: Normal heart sounds.  ?Pulmonary:  ?   Effort: Pulmonary effort is normal.  ?   Breath sounds: Normal breath sounds. No wheezing, rhonchi or rales.  ?Musculoskeletal:  ?   Cervical back: Normal range of motion and neck supple.  ?Skin: ?   General: Skin is warm and dry.  ?Neurological:  ?   General: No focal  deficit present.  ?   Mental Status: He is alert and oriented to person, place, and time. Mental status is at baseline.  ? ? ? ?UC Treatments / Results  ?Labs ?(all labs ordered are listed, but only abnormal results are displayed) ?Labs Reviewed - No data to display ? ?EKG ? ? ?Radiology ?No results found. ? ?Procedures ?Procedures (including critical care time) ? ?Medications Ordered in UC ?Medications - No data to display ? ?Initial Impression / Assessment and Plan / UC Course  ?I have reviewed the triage vital signs and the nursing notes. ? ?Pertinent labs & imaging results that were available during my care of the patient were reviewed by me and considered in my medical decision making (see chart for details). ? ?  ? ?MDM: 1.  Pruritic rash-Rx'd Medrol Dosepak. Advised patient if skin becomes irritated or rash becomes visible may start Medrol Dosepak.  Advised patient if starting medication and take to completion with food.  Advised patient if rash worsens and/or unknown resolved please follow-up with PCP or here for further evaluation.  Patient discharged home, hemodynamically stable. ?Final Clinical Impressions(s) / UC Diagnoses  ? ?Final diagnoses:  ?Pruritic rash  ? ? ? ?Discharge Instructions   ? ?  ?Advised patient if skin becomes irritated or rash becomes visible may start Medrol Dosepak.  Advised patient if starting medication and take to completion with food.  Advised patient if rash worsens and/or unknown resolved please follow-up with PCP or here for further evaluation.  ? ? ? ? ?ED Prescriptions   ? ? Medication Sig Dispense Auth. Provider  ? methylPREDNISolone (MEDROL DOSEPAK) 4 MG TBPK tablet Take as directed 1 each , FNP  ? ?  ? ?PDMP not reviewed this encounter. ?  ?Trevor Iha, FNP ?12/20/21 1923 ? ?

## 2021-12-22 NOTE — Telephone Encounter (Signed)
Appt scheduled

## 2021-12-31 ENCOUNTER — Ambulatory Visit (INDEPENDENT_AMBULATORY_CARE_PROVIDER_SITE_OTHER): Payer: 59 | Admitting: Family Medicine

## 2021-12-31 DIAGNOSIS — B356 Tinea cruris: Secondary | ICD-10-CM

## 2022-02-04 ENCOUNTER — Ambulatory Visit: Payer: 59 | Admitting: Sports Medicine

## 2022-02-11 ENCOUNTER — Ambulatory Visit: Payer: 59 | Admitting: Sports Medicine

## 2022-02-11 ENCOUNTER — Encounter: Payer: Self-pay | Admitting: Sports Medicine

## 2022-02-11 ENCOUNTER — Ambulatory Visit (INDEPENDENT_AMBULATORY_CARE_PROVIDER_SITE_OTHER): Payer: 59

## 2022-02-11 DIAGNOSIS — M545 Low back pain, unspecified: Secondary | ICD-10-CM

## 2022-02-11 MED ORDER — CYCLOBENZAPRINE HCL 10 MG PO TABS: ORAL_TABLET | ORAL | 0 refills | Status: DC

## 2022-02-11 MED ORDER — PREDNISONE 50 MG PO TABS: ORAL_TABLET | ORAL | 0 refills | Status: DC

## 2022-02-11 MED ORDER — AMLODIPINE BESYLATE 5 MG PO TABS: 5.0000 mg | ORAL_TABLET | Freq: Every day | ORAL | 0 refills | Status: DC

## 2022-02-11 MED ORDER — MELOXICAM 15 MG PO TABS: ORAL_TABLET | ORAL | 3 refills | Status: DC

## 2022-02-11 NOTE — Progress Notes (Signed)
    Procedures performed today:    None.  Independent interpretation of notes and tests performed by another provider:   None.  Brief History, Exam, Impression, and Recommendations:    Low back pain Pleasant 36 year old male, has had a few episodes of acute low back pain over the past several years, the most recent of which has been present for the past couple of weeks, severe, right-sided low back with radiation to the right buttock and thigh but not past the knee. No red flag symptoms. We discussed the anatomy and pathophysiology, adding prednisone, Flexeril, meloxicam per Adding lumbar spine x-rays, home condition, return to see me in 6 weeks, MRI for interventional planning if not better. We also discussed a pillow for the seat in his truck.    ___________________________________________ Ihor Austin. Benjamin Stain, M.D., ABFM., CAQSM. Primary Care and Sports Medicine  [facility]  Adjunct Instructor of Family Medicine  [employer]

## 2022-02-11 NOTE — Assessment & Plan Note (Signed)
Pleasant 36 year old male, has had a few episodes of acute low back pain over the past several years, the most recent of which has been present for the past couple of weeks, severe, right-sided low back with radiation to the right buttock and thigh but not past the knee. No red flag symptoms. We discussed the anatomy and pathophysiology, adding prednisone, Flexeril, meloxicam per Adding lumbar spine x-rays, home condition, return to see me in 6 weeks, MRI for interventional planning if not better. We also discussed a pillow for the seat in his truck.

## 2022-02-11 NOTE — Addendum Note (Signed)
Addended by: Monica Becton on: 02/11/2022 10:01 AM   Modules accepted: Orders

## 2022-02-22 ENCOUNTER — Encounter: Payer: Self-pay | Admitting: Sports Medicine

## 2022-03-07 NOTE — Telephone Encounter (Signed)
As is typical, appointment needed for FMLA forms.

## 2022-03-25 ENCOUNTER — Ambulatory Visit: Payer: 59 | Admitting: Sports Medicine

## 2022-03-25 DIAGNOSIS — M5136 Other intervertebral disc degeneration, lumbar region: Secondary | ICD-10-CM | POA: Diagnosis not present

## 2022-03-25 DIAGNOSIS — M51369 Other intervertebral disc degeneration, lumbar region without mention of lumbar back pain or lower extremity pain: Secondary | ICD-10-CM

## 2022-03-25 NOTE — Progress Notes (Signed)
    Procedures performed today:    None.  Independent interpretation of notes and tests performed by another provider:   None.  Brief History, Exam, Impression, and Recommendations:    Lumbar degenerative disc disease This is a very pleasant 36 year old male, he was seen about 6 weeks ago for several episodes of acute low back pain. He had pain for several years. Pain was typically severe, right-sided with radiation down the right buttock and thigh but not past the knee. 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 We did prednisone, Flexeril, meloxicam, home conditioning, he returns today doing significantly better. He tells me he will need FMLA paperwork filled out for his job, we do not have this but I did collect the necessary information, so when I do see his FMLA paperwork I am happy to just fill it out.  He is looking for intermittent leave, first day of symptoms 01/22/2022, first day out of work: 02/22/2022, he had 2 weeks out initially, he was back to work 03/02/2022 and he will need 7 days/month of intermittent leave.    ____________________________________________ Ihor Austin. Benjamin Stain, M.D., ABFM., CAQSM., AME. Primary Care and Sports Medicine [facility]  Adjunct Professor of Family Medicine  [employer]  Restaurant manager, fast food

## 2022-03-25 NOTE — Assessment & Plan Note (Signed)
This is a very pleasant 36 year old male, he was seen about 6 weeks ago for several episodes of acute low back pain. He had pain for several years. Pain was typically severe, right-sided with radiation down the right buttock and thigh but not past the knee. 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 We did prednisone, Flexeril, meloxicam, home conditioning, he returns today doing significantly better. He tells me he will need FMLA paperwork filled out for his job, we do not have this but I did collect the necessary information, so when I do see his FMLA paperwork I am happy to just fill it out.  He is looking for intermittent leave, first day of symptoms 01/22/2022, first day out of work: 02/22/2022, he had 2 weeks out initially, he was back to work 03/02/2022 and he will need 7 days/month of intermittent leave.

## 2022-04-26 ENCOUNTER — Encounter: Payer: Self-pay | Admitting: Sports Medicine

## 2022-06-17 ENCOUNTER — Telehealth (INDEPENDENT_AMBULATORY_CARE_PROVIDER_SITE_OTHER): Payer: 59 | Admitting: Sports Medicine

## 2022-06-17 DIAGNOSIS — M51369 Other intervertebral disc degeneration, lumbar region without mention of lumbar back pain or lower extremity pain: Secondary | ICD-10-CM

## 2022-06-17 DIAGNOSIS — M5136 Other intervertebral disc degeneration, lumbar region: Secondary | ICD-10-CM | POA: Diagnosis not present

## 2022-06-17 DIAGNOSIS — B356 Tinea cruris: Secondary | ICD-10-CM | POA: Diagnosis not present

## 2022-06-17 NOTE — Progress Notes (Addendum)
   Virtual Visit via WebEx/MyChart   I connected with  Donald Russell  on 06/17/22 via WebEx/MyChart/Doximity Video and verified that I am speaking with the correct person using two identifiers.   I discussed the limitations, risks, security and privacy concerns of performing an evaluation and management service by WebEx/MyChart/Doximity Video, including the higher likelihood of inaccurate diagnosis and treatment, and the availability of in person appointments.  We also discussed the likely need of an additional face to face encounter for complete and high quality delivery of care.  I also discussed with the patient that there may be a patient responsible charge related to this service. The patient expressed understanding and wishes to proceed.  Provider location is in medical facility. Patient location is at their home, different from provider location. People involved in care of the patient during this telehealth encounter were myself, my nurse/medical assistant, and my front office/scheduling team member.  Review of Systems: No fevers, chills, night sweats, weight loss, chest pain, or shortness of breath.   Objective Findings:    General: Speaking full sentences, no audible heavy breathing.  Sounds alert and appropriately interactive.  Appears well.  Face symmetric.  Extraocular movements intact.  Pupils equal and round.  No nasal flaring or accessory muscle use visualized.  Independent interpretation of tests performed by another provider:   None.  Brief History, Exam, Impression, and Recommendations:    Lumbar degenerative disc disease Donald Russell returns in a video visit, we treated him for acute low back pain, he did recover, he needed additional FMLA paperwork filled out so we filled out today, and will be scanned and returned to him  Tinea cruris Also discussing some skin irritation in the inguinal crease and upper thigh. Sounds like jock itch, I advised him that we really cannot  make a diagnosis for a skin complaint over a virtual visit and that he should try some topical Lamisil for 2 weeks and then follow-up with PCP if not better.  I discussed the above assessment and treatment plan with the patient. The patient was provided an opportunity to ask questions and all were answered. The patient agreed with the plan and demonstrated an understanding of the instructions.   The patient was advised to call back or seek an in-person evaluation if the symptoms worsen or if the condition fails to improve as anticipated.   I provided 30 minutes of face to face and non-face-to-face time during this encounter date, time was needed to gather information, review chart, records, communicate/coordinate with staff remotely, as well as complete documentation.   ____________________________________________ Gwen Her. Dianah Field, M.D., ABFM., CAQSM., AME. Primary Care and Sports Medicine [place] [facility]  Adjunct Professor of [employer]  Risk manager

## 2022-06-17 NOTE — Assessment & Plan Note (Signed)
Also discussing some skin irritation in the inguinal crease and upper thigh. Sounds like jock itch, I advised him that we really cannot make a diagnosis for a skin complaint over a virtual visit and that he should try some topical Lamisil for 2 weeks and then follow-up with PCP if not better.

## 2022-06-17 NOTE — Assessment & Plan Note (Addendum)
Donald Russell returns in a video visit, we treated him for acute low back pain, he did recover, he needed additional FMLA paperwork filled out so we filled out today, and will be scanned and returned to him

## 2022-06-20 ENCOUNTER — Encounter: Payer: Self-pay | Admitting: Sports Medicine

## 2022-06-20 DIAGNOSIS — M545 Low back pain, unspecified: Secondary | ICD-10-CM

## 2022-06-20 MED ORDER — MELOXICAM 15 MG PO TABS: ORAL_TABLET | ORAL | 3 refills | Status: DC

## 2022-07-21 ENCOUNTER — Ambulatory Visit: Payer: 59 | Admitting: Family Medicine

## 2022-07-21 ENCOUNTER — Encounter: Payer: Self-pay | Admitting: Family Medicine

## 2022-07-21 VITALS — BP 178/114 | HR 105 | Ht 65.0 in | Wt 197.0 lb

## 2022-07-21 DIAGNOSIS — M51369 Other intervertebral disc degeneration, lumbar region without mention of lumbar back pain or lower extremity pain: Secondary | ICD-10-CM

## 2022-07-21 DIAGNOSIS — M5136 Other intervertebral disc degeneration, lumbar region: Secondary | ICD-10-CM | POA: Diagnosis not present

## 2022-07-21 DIAGNOSIS — M545 Low back pain, unspecified: Secondary | ICD-10-CM | POA: Diagnosis not present

## 2022-07-21 DIAGNOSIS — Z202 Contact with and (suspected) exposure to infections with a predominantly sexual mode of transmission: Secondary | ICD-10-CM | POA: Diagnosis not present

## 2022-07-21 DIAGNOSIS — B356 Tinea cruris: Secondary | ICD-10-CM | POA: Diagnosis not present

## 2022-07-21 LAB — POCT URINALYSIS DIP (CLINITEK)
Blood, UA: NEGATIVE
Glucose, UA: NEGATIVE mg/dL
Ketones, POC UA: NEGATIVE mg/dL
Leukocytes, UA: NEGATIVE
Nitrite, UA: NEGATIVE
POC PROTEIN,UA: 30 — AB
Spec Grav, UA: >=1.03 — AB (ref 1.010–1.025)
Urobilinogen, UA: 1 E.U./dL
pH, UA: 6.5 (ref 5.0–8.0)

## 2022-07-21 MED ORDER — KETOCONAZOLE 2 % EX CREA: 1.0000 | TOPICAL_CREAM | Freq: Two times a day (BID) | CUTANEOUS | 0 refills | Status: DC

## 2022-07-21 MED ORDER — MELOXICAM 15 MG PO TABS: ORAL_TABLET | ORAL | 0 refills | Status: DC

## 2022-07-21 NOTE — Assessment & Plan Note (Signed)
-   believe pt has tinea cruris in his inguinal area - have given ketoconazole cream to help with area.

## 2022-07-21 NOTE — Progress Notes (Signed)
Acute Office Visit  Subjective:     Patient ID: Donald Russell, male    DOB: 1985-12-07, 36 y.o.   MRN: [id number]  Chief Complaint  Patient presents with   Exposure to STD    HPI Patient is in today for std like symptoms today. He did have intercourse with a new partner last weekend and is worried he contracted an STD. He denies any penile discharge. Pt says he is having some irritation and a burning sensation in his groin area.   Review of Systems  Constitutional:  Negative for chills and fever.  Respiratory:  Negative for cough and shortness of breath.   Cardiovascular:  Negative for chest pain.  Neurological:  Negative for headaches.        Objective:    BP (!) 178/114   Pulse (!) 105   Ht 5\' 5"  (1.651 m)   Wt 197 lb (89.4 kg)   SpO2 100%   BMI 32.78 kg/m    Physical Exam Vitals and nursing note reviewed.  Constitutional:      General: He is not in acute distress.    Appearance: Normal appearance.     Comments: Appears nervous  HENT:     Head: Normocephalic and atraumatic.     Right Ear: External ear normal.     Left Ear: External ear normal.     Nose: Nose normal.  Eyes:     Conjunctiva/sclera: Conjunctivae normal.  Cardiovascular:     Rate and Rhythm: Normal rate.  Pulmonary:     Effort: Pulmonary effort is normal.  Genitourinary:    Comments: No erythema or swelling in groin area.  Neurological:     General: No focal deficit present.     Mental Status: He is alert and oriented to person, place, and time.  Psychiatric:        Mood and Affect: Mood normal.        Behavior: Behavior normal.        Thought Content: Thought content normal.        Judgment: Judgment normal.     No results found for any visits on 07/21/22.      Assessment & Plan:   Problem List Items Addressed This Visit       Musculoskeletal and Integument   Lumbar degenerative disc disease    Pt said he ran out of his medication and didn't pick up prescription renewal  on time.       Relevant Medications   meloxicam (MOBIC) 15 MG tablet   Jock itch    - believe pt has tinea cruris in his inguinal area - have given ketoconazole cream to help with area.       Relevant Medications   ketoconazole (NIZORAL) 2 % cream     Other   Exposure to STD - Primary    - pt concerned about std exposure. No penile discharge or lesions noted by patient  - will go ahead and order STD screening  - recommended condom use and safe sex practices  - did a UA since patient noted a weird burning sensation in his groin. I interpreted UA as negative       Relevant Orders   GC Probe amplification, urine   Trichomonas vaginalis, RNA   HIV antibody (with reflex)   RPR   C. trachomatis/N. gonorrhoeae RNA   Other Visit Diagnoses     Acute right-sided low back pain without sciatica  Relevant Medications   meloxicam (MOBIC) 15 MG tablet       Meds ordered this encounter  Medications   ketoconazole (NIZORAL) 2 % cream    Sig: Apply 1 Application topically 2 (two) times daily. groin    Dispense:  60 g    Refill:  0   meloxicam (MOBIC) 15 MG tablet    Sig: One tab PO every 24 hours with a meal for 2 weeks, then once every 24 hours prn pain.    Dispense:  30 tablet    Refill:  0    No follow-ups on file.  Owens Loffler, DO

## 2022-07-21 NOTE — Addendum Note (Signed)
Addended by: Cline Crock on: 07/21/2022 02:52 PM   Modules accepted: Orders

## 2022-07-21 NOTE — Assessment & Plan Note (Signed)
Pt said he ran out of his medication and didn't pick up prescription renewal on time.

## 2022-07-21 NOTE — Assessment & Plan Note (Signed)
-   pt concerned about std exposure. No penile discharge or lesions noted by patient  - will go ahead and order STD screening  - recommended condom use and safe sex practices  - did a UA since patient noted a weird burning sensation in his groin. I interpreted UA as negative

## 2022-07-22 ENCOUNTER — Encounter: Payer: Self-pay | Admitting: Family Medicine

## 2022-07-22 NOTE — Telephone Encounter (Signed)
Called patient he states he did have blood drawn.  Wondering if urinalysis done in clinic showed any STI resulting. I informed him that it did not and that the urine could have been sent for testing but would have to be resulted by the lab . He asked if could expect results by next week , told him this was possible but that we could not guarantee when the results would return.

## 2022-07-23 LAB — C. TRACHOMATIS/N. GONORRHOEAE RNA
C. trachomatis RNA, TMA: NOT DETECTED
N. gonorrhoeae RNA, TMA: NOT DETECTED

## 2022-07-23 LAB — RPR: RPR Ser Ql: NONREACTIVE

## 2022-07-23 LAB — TRICHOMONAS VAGINALIS, PROBE AMP: Trichomonas vaginalis RNA: NOT DETECTED

## 2022-07-23 LAB — HIV ANTIBODY (ROUTINE TESTING W REFLEX): HIV 1&2 Ab, 4th Generation: NONREACTIVE

## 2022-07-29 ENCOUNTER — Encounter: Payer: Self-pay | Admitting: Family Medicine

## 2022-08-01 MED ORDER — TERBINAFINE HCL 250 MG PO TABS: 250.0000 mg | ORAL_TABLET | Freq: Every day | ORAL | 0 refills | Status: DC

## 2022-08-01 NOTE — Progress Notes (Signed)
Encounter opened in error rather than order set no charge.

## 2022-08-24 ENCOUNTER — Other Ambulatory Visit: Payer: Self-pay | Admitting: Family Medicine

## 2022-08-25 MED ORDER — AMLODIPINE BESYLATE 5 MG PO TABS: 5.0000 mg | ORAL_TABLET | Freq: Every day | ORAL | 0 refills | Status: DC

## 2022-10-25 ENCOUNTER — Telehealth: Payer: Self-pay | Admitting: Family Medicine

## 2022-10-26 NOTE — Telephone Encounter (Signed)
Pls contact patient to schedule appt for HTN. No additional refills of medication. Thanks

## 2022-10-26 NOTE — Telephone Encounter (Signed)
Called patient to schedule appointment, patient will give office a call back when he gets schedule to come into office, thanks.

## 2022-11-10 ENCOUNTER — Other Ambulatory Visit: Payer: Self-pay | Admitting: Family Medicine

## 2022-11-21 ENCOUNTER — Other Ambulatory Visit: Payer: Self-pay | Admitting: Pharmacist

## 2022-11-21 NOTE — Progress Notes (Signed)
Patient appearing on report for [facility] - Hypertension Control report due to last documented ambulatory blood pressure of 178/114 on 07/21/22. Next appointment with PCP is unscheduled.   Outreached patient to discuss hypertension control and medication management. Left voicemail for patient to return my call at their convenience.   Larinda Buttery, PharmD Clinical Pharmacist [facility] [phone]

## 2022-12-12 ENCOUNTER — Encounter (INDEPENDENT_AMBULATORY_CARE_PROVIDER_SITE_OTHER): Payer: 59 | Admitting: Sports Medicine

## 2022-12-12 DIAGNOSIS — M5136 Other intervertebral disc degeneration, lumbar region: Secondary | ICD-10-CM

## 2022-12-12 DIAGNOSIS — M51369 Other intervertebral disc degeneration, lumbar region without mention of lumbar back pain or lower extremity pain: Secondary | ICD-10-CM

## 2022-12-13 ENCOUNTER — Ambulatory Visit: Payer: 59 | Admitting: Family Medicine

## 2022-12-13 ENCOUNTER — Encounter: Payer: Self-pay | Admitting: Family Medicine

## 2022-12-13 VITALS — BP 142/88 | HR 87 | Ht 65.0 in | Wt 204.0 lb

## 2022-12-13 DIAGNOSIS — B356 Tinea cruris: Secondary | ICD-10-CM | POA: Diagnosis not present

## 2022-12-13 DIAGNOSIS — Z202 Contact with and (suspected) exposure to infections with a predominantly sexual mode of transmission: Secondary | ICD-10-CM | POA: Diagnosis not present

## 2022-12-13 MED ORDER — ITRACONAZOLE 100 MG PO CAPS: 200.0000 mg | ORAL_CAPSULE | Freq: Every day | ORAL | 0 refills | Status: AC

## 2022-12-13 NOTE — Progress Notes (Signed)
Donald Russell - 37 y.o. male MRN [id number]  Date of birth: December 17, 1985  Subjective Chief Complaint  Patient presents with   Exposure to STD   Rash    HPI Donald Russell is a 37 y.o. male here today with complaint of rash.  He has had recurrent rash in the groin.  Previously responded to antifungals, treated with oral terbinafine.  Topical antifungals did not seem to help a whole lot.  STI testing was negative at that time.  He would like to have repeat STI testing due to potential exposure.  Denies pain with urination, frequency, hematuria, testicular pain or penile discharge.  ROS:  A comprehensive ROS was completed and negative except as noted per HPI  No Known Allergies  Past Medical History:  Diagnosis Date   Hyperlipidemia    Hypertension    Thyroid disease     History reviewed. No pertinent surgical history.  Social History   Socioeconomic History   Marital status: Single    Spouse name: Not on file   Number of children: Not on file   Years of education: Not on file   Highest education level: Not on file  Occupational History   Not on file  Tobacco Use   Smoking status: Former    Packs/day: 0.25    Years: 3.00    Additional pack years: 0.00    Total pack years: 0.75    Types: Cigarettes   Smokeless tobacco: Never  Vaping Use   Vaping Use: Never used  Substance and Sexual Activity   Alcohol use: Yes    Alcohol/week: 7.0 standard drinks of alcohol    Types: 7 Cans of beer per week    Comment: weekly   Drug use: No   Sexual activity: Yes  Other Topics Concern   Not on file  Social History Narrative   Not on file   Social Determinants of Health   Financial Resource Strain: Not on file  Food Insecurity: Not on file  Transportation Needs: Not on file  Physical Activity: Not on file  Stress: Not on file  Social Connections: Not on file    Family History  Problem Relation Age of Onset   Hypertension Mother    Hypertension Brother    Kidney  disease Brother    Alcohol abuse Neg Hx    Cancer Neg Hx    Diabetes Neg Hx    Early death Neg Hx    Hearing loss Neg Hx    Heart disease Neg Hx    Hyperlipidemia Neg Hx    Stroke Neg Hx     Health Maintenance  Topic Date Due   COVID-19 Vaccine (1) Never done   Hepatitis C Screening  Never done   INFLUENZA VACCINE  12/18/2022 (Originally 04/19/2022)   DTaP/Tdap/Td (3 - Td or Tdap) 07/05/2028   HIV Screening  Completed   HPV VACCINES  Aged Out     ----------------------------------------------------------------------------------------------------------------------------------------------------------------------------------------------------------------- Physical Exam BP (!) 142/88 (BP Location: Left Arm, Patient Position: Sitting, Cuff Size: Large)   Pulse 87   Ht 5\' 5"  (1.651 m)   Wt 204 lb (92.5 kg)   SpO2 98%   BMI 33.95 kg/m   Physical Exam Constitutional:      Appearance: Normal appearance.  HENT:     Head: Normocephalic and atraumatic.  Eyes:     General: No scleral icterus. Genitourinary:    Penis: Normal.      Testes: Normal.     Comments:  Skin in the groin is dry and scaly.. Neurological:     General: No focal deficit present.     Mental Status: He is alert.     ------------------------------------------------------------------------------------------------------------------------------------------------------------------------------------------------------------------- Assessment and Plan  Jock itch Treating with 1 week course of Sporanox.  Exposure to STD Updated STI screening ordered.   Meds ordered this encounter  Medications   itraconazole (SPORANOX) 100 MG capsule    Sig: Take 2 capsules (200 mg total) by mouth daily for 7 days.    Dispense:  14 capsule    Refill:  0    No follow-ups on file.    This visit occurred during the SARS-CoV-2 public health emergency.  Safety protocols were in place, including screening questions prior to  the visit, additional usage of staff PPE, and extensive cleaning of exam room while observing appropriate contact time as indicated for disinfecting solutions.

## 2022-12-13 NOTE — Telephone Encounter (Signed)
I spent 5 total minutes of online digital evaluation and management services in this patient-initiated request for online care. 

## 2022-12-13 NOTE — Assessment & Plan Note (Signed)
Updated STI screening ordered.

## 2022-12-13 NOTE — Assessment & Plan Note (Signed)
Treating with 1 week course of Sporanox.

## 2022-12-16 LAB — BASIC METABOLIC PANEL
BUN: 14 mg/dL (ref 7–25)
CO2: 29 mmol/L (ref 20–32)
Calcium: 9.9 mg/dL (ref 8.6–10.3)
Chloride: 99 mmol/L (ref 98–110)
Creat: 1.02 mg/dL (ref 0.60–1.26)
Glucose, Bld: 93 mg/dL (ref 65–99)
Potassium: 4 mmol/L (ref 3.5–5.3)
Sodium: 137 mmol/L (ref 135–146)

## 2022-12-16 LAB — RPR: RPR Ser Ql: NONREACTIVE

## 2022-12-16 LAB — TRICHOMONAS VAGINALIS, PROBE AMP: Trichomonas vaginalis RNA: NOT DETECTED

## 2022-12-16 LAB — HIV ANTIBODY (ROUTINE TESTING W REFLEX): HIV 1&2 Ab, 4th Generation: NONREACTIVE

## 2022-12-16 LAB — HEMOGLOBIN A1C
Hgb A1c MFr Bld: 5.9 % of total Hgb — ABNORMAL HIGH (ref <5.7)
Mean Plasma Glucose: 123 mg/dL
eAG (mmol/L): 6.8 mmol/L

## 2022-12-16 LAB — CHLAMYDIA/NEISSERIA GONORRHOEAE RNA,TMA,UROGENTIAL
C. trachomatis RNA, TMA: NOT DETECTED
N. gonorrhoeae RNA, TMA: NOT DETECTED

## 2022-12-29 ENCOUNTER — Other Ambulatory Visit: Payer: Self-pay | Admitting: Family Medicine

## 2022-12-29 DIAGNOSIS — B356 Tinea cruris: Secondary | ICD-10-CM

## 2022-12-29 MED ORDER — TERBINAFINE HCL 250 MG PO TABS: 250.0000 mg | ORAL_TABLET | Freq: Every day | ORAL | 0 refills | Status: AC

## 2023-01-02 ENCOUNTER — Other Ambulatory Visit: Payer: Self-pay | Admitting: Family Medicine

## 2023-01-02 DIAGNOSIS — I10 Essential (primary) hypertension: Secondary | ICD-10-CM

## 2023-01-20 ENCOUNTER — Encounter: Payer: 59 | Admitting: Family Medicine

## 2023-02-08 ENCOUNTER — Encounter: Payer: Self-pay | Admitting: Family Medicine

## 2023-02-08 DIAGNOSIS — B356 Tinea cruris: Secondary | ICD-10-CM

## 2023-02-08 MED ORDER — TERBINAFINE HCL 250 MG PO TABS: 250.0000 mg | ORAL_TABLET | Freq: Every day | ORAL | 0 refills | Status: AC

## 2023-02-08 NOTE — Telephone Encounter (Signed)
Rx for lamisil renewed.   CM

## 2023-02-22 ENCOUNTER — Telehealth: Payer: Self-pay | Admitting: Family Medicine

## 2023-02-22 DIAGNOSIS — I10 Essential (primary) hypertension: Secondary | ICD-10-CM

## 2023-02-22 NOTE — Telephone Encounter (Signed)
Patient called he had to reschedule his appointment for Friday June 6th because of a conflict he is asking for refills on his Amlodipine 5mg  enough until he has his appt [facility] [place] [phone]

## 2023-02-23 ENCOUNTER — Other Ambulatory Visit: Payer: Self-pay | Admitting: Family Medicine

## 2023-02-23 DIAGNOSIS — I10 Essential (primary) hypertension: Secondary | ICD-10-CM

## 2023-02-23 MED ORDER — AMLODIPINE BESYLATE 5 MG PO TABS: ORAL_TABLET | ORAL | 0 refills | Status: DC

## 2023-02-24 ENCOUNTER — Encounter: Payer: 59 | Admitting: Family Medicine

## 2023-03-08 ENCOUNTER — Encounter: Payer: Self-pay | Admitting: Family Medicine

## 2023-03-08 ENCOUNTER — Ambulatory Visit (INDEPENDENT_AMBULATORY_CARE_PROVIDER_SITE_OTHER): Payer: 59 | Admitting: Family Medicine

## 2023-03-08 VITALS — BP 140/101 | HR 87 | Ht 65.0 in | Wt 196.0 lb

## 2023-03-08 DIAGNOSIS — E7800 Pure hypercholesterolemia, unspecified: Secondary | ICD-10-CM

## 2023-03-08 DIAGNOSIS — I10 Essential (primary) hypertension: Secondary | ICD-10-CM

## 2023-03-08 DIAGNOSIS — Z0000 Encounter for general adult medical examination without abnormal findings: Secondary | ICD-10-CM

## 2023-03-08 MED ORDER — NYSTATIN 100000 UNIT/GM EX POWD: 1.0000 | Freq: Three times a day (TID) | CUTANEOUS | 3 refills | Status: DC

## 2023-03-08 NOTE — Progress Notes (Signed)
Donald Russell - 37 y.o. male MRN [id number]  Date of birth: 06-Aug-1986  Subjective Chief Complaint  Patient presents with   Hypertension    HPI Donald Russell is a 37 y.o.  male here today for annual exam.   He reports that he is doing well.  Remains on amlodipine for management of HTN.   He is moderately active.  He feels that diet is pretty good.   He is smoking 1-2 cigars each week  Consumes about 2 beers daily.   Review of Systems  Constitutional:  Negative for chills, fever, malaise/fatigue and weight loss.  HENT:  Negative for congestion, ear pain and sore throat.   Eyes:  Negative for blurred vision, double vision and pain.  Respiratory:  Negative for cough and shortness of breath.   Cardiovascular:  Negative for chest pain and palpitations.  Gastrointestinal:  Negative for abdominal pain, blood in stool, constipation, heartburn and nausea.  Genitourinary:  Negative for dysuria and urgency.  Musculoskeletal:  Negative for joint pain and myalgias.  Neurological:  Negative for dizziness and headaches.  Endo/Heme/Allergies:  Does not bruise/bleed easily.  Psychiatric/Behavioral:  Negative for depression. The patient is not nervous/anxious and does not have insomnia.       No Known Allergies  Past Medical History:  Diagnosis Date   Hyperlipidemia    Hypertension    Thyroid disease     History reviewed. No pertinent surgical history.  Social History   Socioeconomic History   Marital status: Single    Spouse name: Not on file   Number of children: Not on file   Years of education: Not on file   Highest education level: Not on file  Occupational History   Not on file  Tobacco Use   Smoking status: Former    Packs/day: 0.25    Years: 3.00    Additional pack years: 0.00    Total pack years: 0.75    Types: Cigarettes   Smokeless tobacco: Never  Vaping Use   Vaping Use: Never used  Substance and Sexual Activity   Alcohol use: Yes    Alcohol/week:  7.0 standard drinks of alcohol    Types: 7 Cans of beer per week    Comment: weekly   Drug use: No   Sexual activity: Yes  Other Topics Concern   Not on file  Social History Narrative   Not on file   Social Determinants of Health   Financial Resource Strain: Not on file  Food Insecurity: Not on file  Transportation Needs: Not on file  Physical Activity: Not on file  Stress: Not on file  Social Connections: Not on file    Family History  Problem Relation Age of Onset   Hypertension Mother    Hypertension Brother    Kidney disease Brother    Alcohol abuse Neg Hx    Cancer Neg Hx    Diabetes Neg Hx    Early death Neg Hx    Hearing loss Neg Hx    Heart disease Neg Hx    Hyperlipidemia Neg Hx    Stroke Neg Hx     Health Maintenance  Topic Date Due   COVID-19 Vaccine (1) 08/24/2023 (Originally 08/23/1986)   Hepatitis C Screening  03/07/2024 (Originally 02/21/2006)   INFLUENZA VACCINE  04/20/2023   DTaP/Tdap/Td (3 - Td or Tdap) 07/05/2028   HIV Screening  Completed   HPV VACCINES  Aged Out     ----------------------------------------------------------------------------------------------------------------------------------------------------------------------------------------------------------------- Physical Exam BP Marland Kitchen)  140/101 (BP Location: Left Arm, Patient Position: Sitting, Cuff Size: Large)   Pulse 87   Ht 5\' 5"  (1.651 m)   Wt 196 lb (88.9 kg)   SpO2 98%   BMI 32.62 kg/m   Physical Exam Constitutional:      General: He is not in acute distress. HENT:     Head: Normocephalic and atraumatic.     Right Ear: Tympanic membrane and external ear normal.     Left Ear: Tympanic membrane and external ear normal.  Eyes:     General: No scleral icterus. Neck:     Thyroid: No thyromegaly.  Cardiovascular:     Rate and Rhythm: Normal rate and regular rhythm.     Heart sounds: Normal heart sounds.  Pulmonary:     Effort: Pulmonary effort is normal.     Breath  sounds: Normal breath sounds.  Abdominal:     General: Bowel sounds are normal. There is no distension.     Palpations: Abdomen is soft.     Tenderness: There is no abdominal tenderness. There is no guarding.  Musculoskeletal:     Cervical back: Normal range of motion.  Lymphadenopathy:     Cervical: No cervical adenopathy.  Skin:    General: Skin is warm and dry.     Findings: No rash.  Neurological:     Mental Status: He is alert and oriented to person, place, and time.     Cranial Nerves: No cranial nerve deficit.     Motor: No abnormal muscle tone.  Psychiatric:        Mood and Affect: Mood normal.        Behavior: Behavior normal.     ------------------------------------------------------------------------------------------------------------------------------------------------------------------------------------------------------------------- Assessment and Plan  Well adult exam Well adult Orders Placed This Encounter  Procedures   COMPLETE METABOLIC PANEL WITH GFR   CBC with Differential   Lipid Panel w/reflex Direct LDL   TSH  Screenings: per lab orders Immunizations:  UTD Anticipatory guidance/Risk factor reduction: Recommendations per AVS   Meds ordered this encounter  Medications   nystatin (MYCOSTATIN/NYSTOP) powder    Sig: Apply 1 Application topically 3 (three) times daily.    Dispense:  60 g    Refill:  3    Return in about 2 weeks (around 03/22/2023) for BP check-Nurse visit.    This visit occurred during the SARS-CoV-2 public health emergency.  Safety protocols were in place, including screening questions prior to the visit, additional usage of staff PPE, and extensive cleaning of exam room while observing appropriate contact time as indicated for disinfecting solutions.

## 2023-03-08 NOTE — Assessment & Plan Note (Signed)
Well adult Orders Placed This Encounter  Procedures   COMPLETE METABOLIC PANEL WITH GFR   CBC with Differential   Lipid Panel w/reflex Direct LDL   TSH  Screenings: per lab orders Immunizations: UTD Anticipatory guidance/Risk factor reduction:  Recommendations per AVS.  

## 2023-03-08 NOTE — Patient Instructions (Signed)
Preventive Care 21-35 Years Old, Male Preventive care refers to lifestyle choices and visits with your health care provider that can promote health and wellness. Preventive care visits are also called wellness exams. What can I expect for my preventive care visit? Counseling During your preventive care visit, your health care provider may ask about your: Medical history, including: Past medical problems. Family medical history. Current health, including: Emotional well-being. Home life and relationship well-being. Sexual activity. Lifestyle, including: Alcohol, nicotine or tobacco, and drug use. Access to firearms. Diet, exercise, and sleep habits. Safety issues such as seatbelt and bike helmet use. Sunscreen use. Work and work environment. Physical exam Your health care provider may check your: Height and weight. These may be used to calculate your BMI (body mass index). BMI is a measurement that tells if you are at a healthy weight. Waist circumference. This measures the distance around your waistline. This measurement also tells if you are at a healthy weight and may help predict your risk of certain diseases, such as type 2 diabetes and high blood pressure. Heart rate and blood pressure. Body temperature. Skin for abnormal spots. What immunizations do I need?  Vaccines are usually given at various ages, according to a schedule. Your health care provider will recommend vaccines for you based on your age, medical history, and lifestyle or other factors, such as travel or where you work. What tests do I need? Screening Your health care provider may recommend screening tests for certain conditions. This may include: Lipid and cholesterol levels. Diabetes screening. This is done by checking your blood sugar (glucose) after you have not eaten for a while (fasting). Hepatitis B test. Hepatitis C test. HIV (human immunodeficiency virus) test. STI (sexually transmitted infection)  testing, if you are at risk. Talk with your health care provider about your test results, treatment options, and if necessary, the need for more tests. Follow these instructions at home: Eating and drinking  Eat a healthy diet that includes fresh fruits and vegetables, whole grains, lean protein, and low-fat dairy products. Drink enough fluid to keep your urine pale yellow. Take vitamin and mineral supplements as recommended by your health care provider. Do not drink alcohol if your health care provider tells you not to drink. If you drink alcohol: Limit how much you have to 0-2 drinks a day. Know how much alcohol is in your drink. In the [place] one drink equals one 12 oz bottle of beer (355 mL), one 5 oz glass of wine (148 mL), or one 1 oz glass of hard liquor (44 mL). Lifestyle Brush your teeth every morning and night with fluoride toothpaste. Floss one time each day. Exercise for at least 30 minutes 5 or more days each week. Do not use any products that contain nicotine or tobacco. These products include cigarettes, chewing tobacco, and vaping devices, such as e-cigarettes. If you need help quitting, ask your health care provider. Do not use drugs. If you are sexually active, practice safe sex. Use a condom or other form of protection to prevent STIs. Find healthy ways to manage stress, such as: Meditation, yoga, or listening to music. Journaling. Talking to a trusted person. Spending time with friends and family. Minimize exposure to UV radiation to reduce your risk of skin cancer. Safety Always wear your seat belt while driving or riding in a vehicle. Do not drive: If you have been drinking alcohol. Do not ride with someone who has been drinking. If you have been using any mind-altering substances   or drugs. While texting. When you are tired or distracted. Wear a helmet and other protective equipment during sports activities. If you have firearms in your house, make sure you  follow all gun safety procedures. Seek help if you have been physically or sexually abused. What's next? Go to your health care provider once a year for an annual wellness visit. Ask your health care provider how often you should have your eyes and teeth checked. Stay up to date on all vaccines. This information is not intended to replace advice given to you by your health care provider. Make sure you discuss any questions you have with your health care provider. Document Revised: 03/03/2021 Document Reviewed: 03/03/2021 Elsevier Patient Education  2024 [employer]  

## 2023-03-12 IMAGING — DX DG LUMBAR SPINE COMPLETE 4+V
5 series · 5 of 5 positions shown · non-contrast
Comparison: None Available.

CLINICAL DATA: Right lower back pain, right gluteal and right knee
pain x2 weeks

EXAM:
LUMBAR SPINE - COMPLETE 4+ VIEW

[l-spine ap]
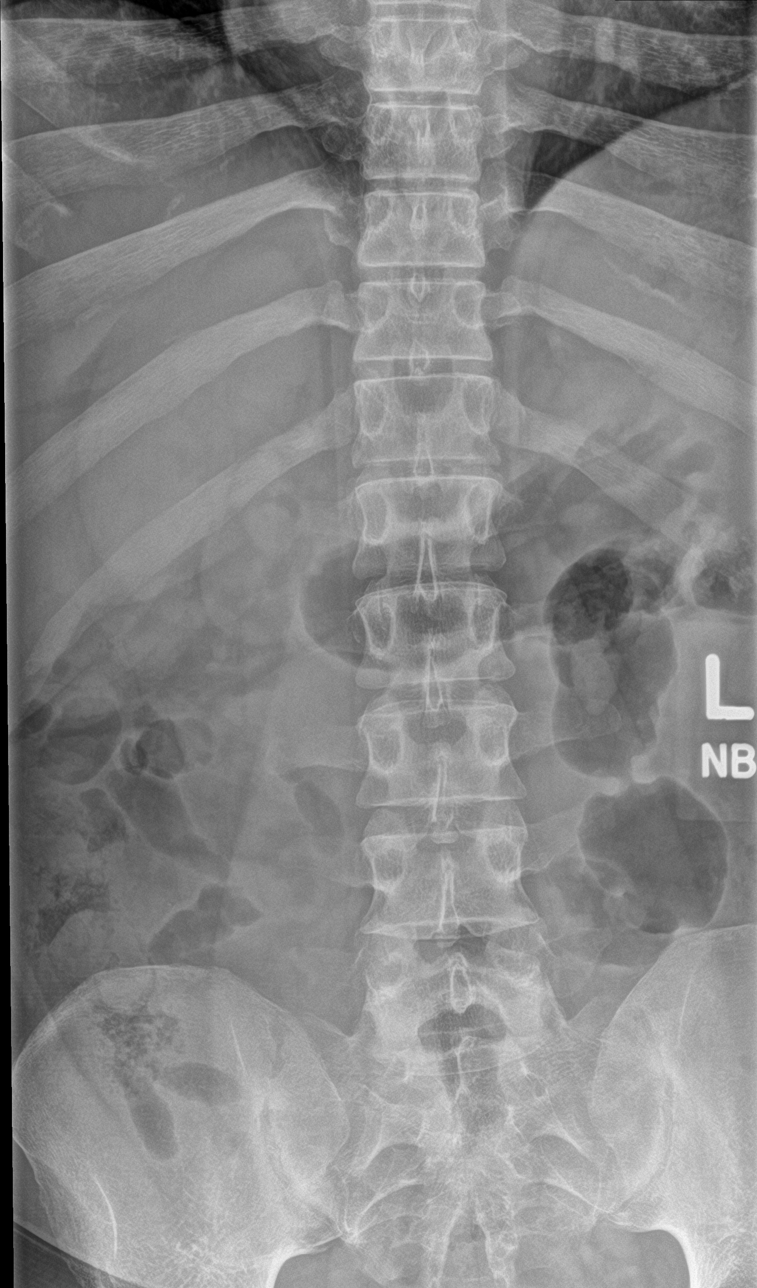

[l-spine obl (1 of 2)]
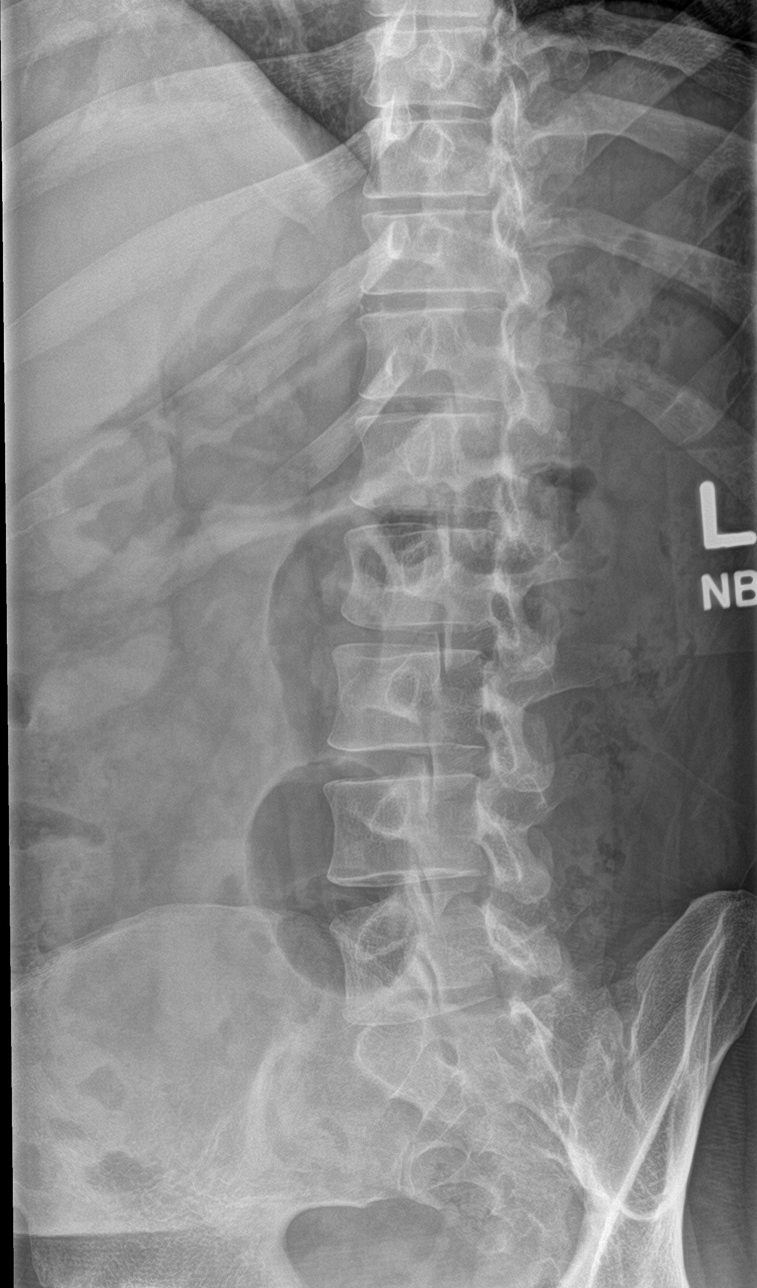

[l-spine obl (2 of 2)]
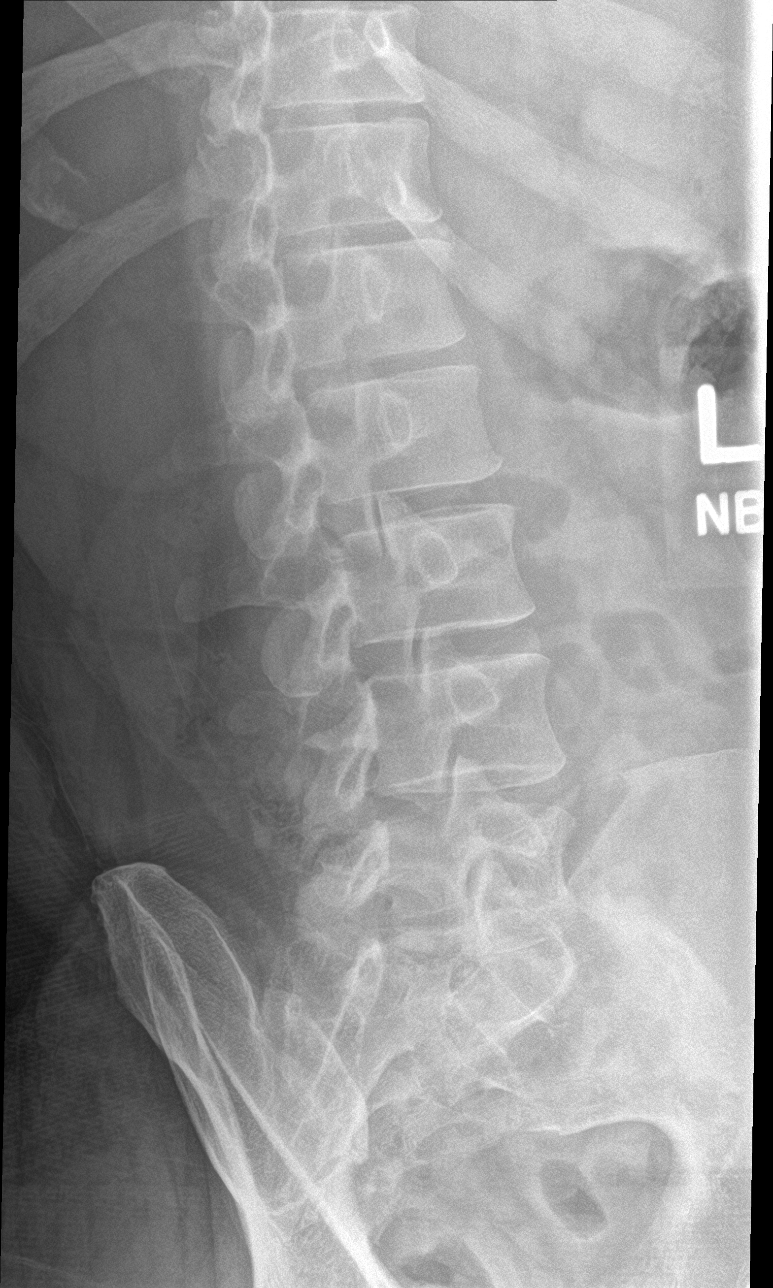

[l-spine lat]
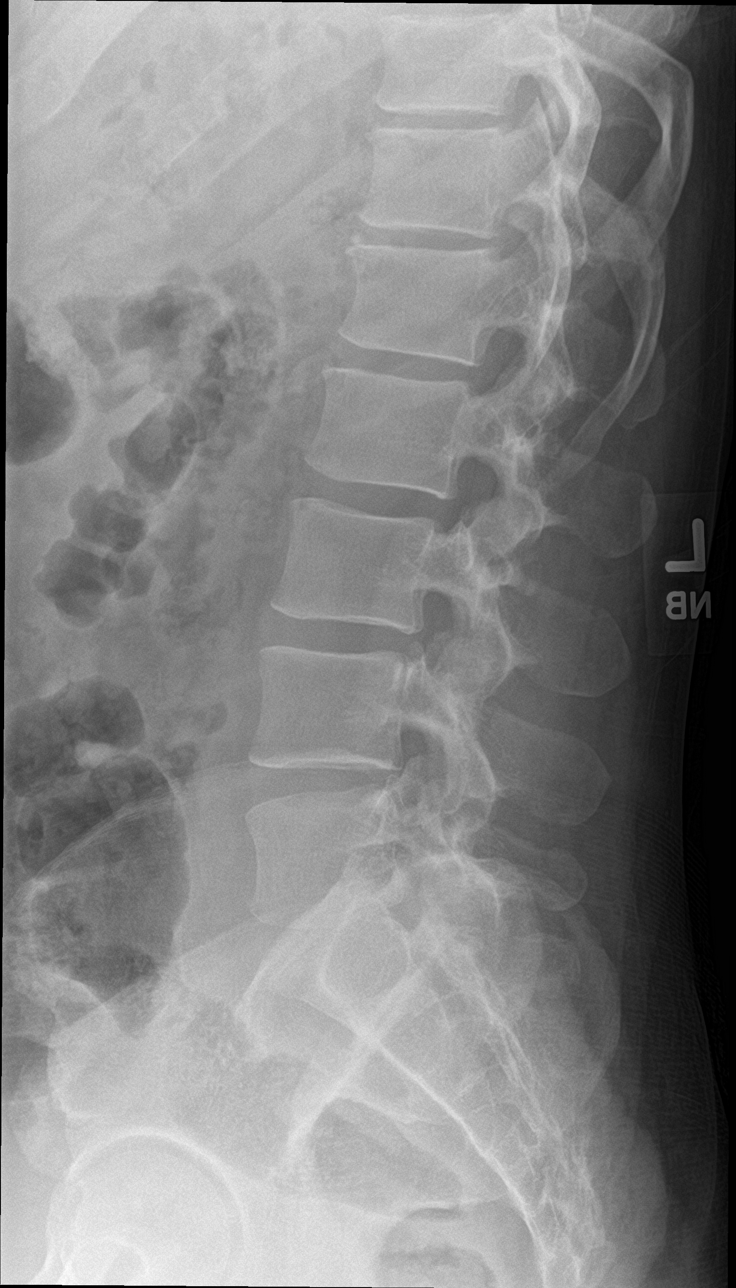

[l-spine spot]
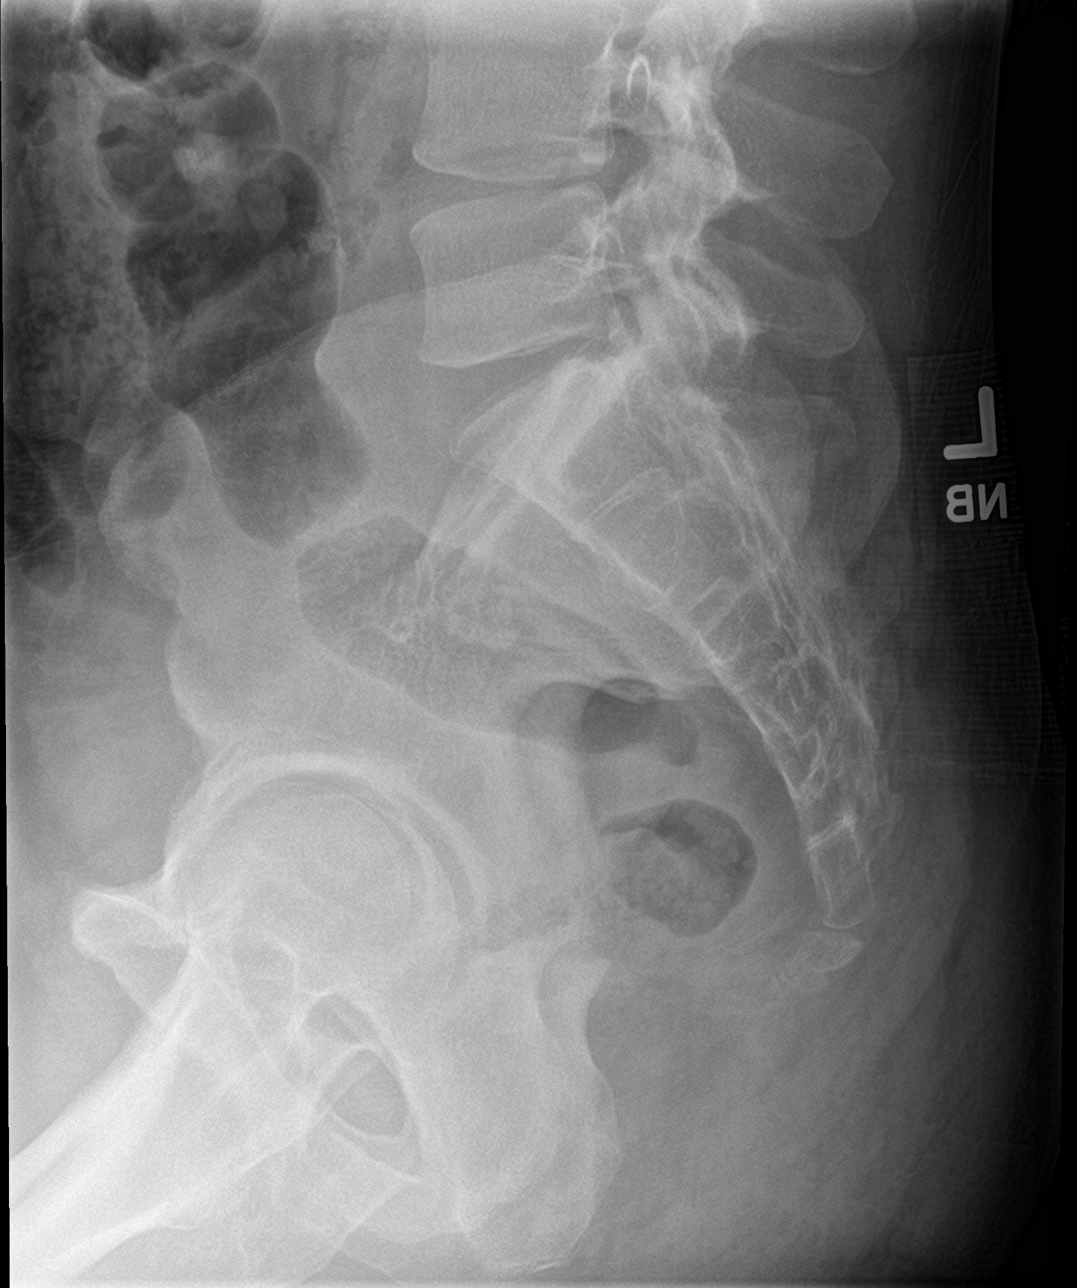

[5 of 5 positions shown; findings below may reference images not displayed]

FINDINGS: No recent fracture is seen. There is minimal retrolisthesis at L5-S1
level. There is mild narrowing of posterior aspect of L5-S1 disc
space. There is facet hypertrophy at L5-S1 level.
IMPRESSION: No recent fracture is seen. Degenerative changes are noted at L5-S1
level with mild disc space narrowing, minimal retrolisthesis and
facet hypertrophy.

## 2023-03-14 ENCOUNTER — Ambulatory Visit: Payer: 59 | Admitting: Family Medicine

## 2023-03-14 ENCOUNTER — Encounter: Payer: Self-pay | Admitting: Family Medicine

## 2023-03-14 VITALS — BP 153/87 | HR 96 | Resp 18 | Ht 65.0 in | Wt 196.0 lb

## 2023-03-14 DIAGNOSIS — Z202 Contact with and (suspected) exposure to infections with a predominantly sexual mode of transmission: Secondary | ICD-10-CM

## 2023-03-14 DIAGNOSIS — B356 Tinea cruris: Secondary | ICD-10-CM

## 2023-03-14 LAB — CBC WITH DIFFERENTIAL/PLATELET
Basophils Relative: 1.9 %
Eosinophils Relative: 4.5 %
Lymphs Abs: 2498 cells/uL (ref 850–3900)
Monocytes Relative: 9.7 %
Platelets: 268 10*3/uL (ref 140–400)
Total Lymphocyte: 54.3 %
WBC: 4.6 10*3/uL (ref 3.8–10.8)

## 2023-03-14 MED ORDER — KETOCONAZOLE 2 % EX CREA: 1.0000 | TOPICAL_CREAM | Freq: Two times a day (BID) | CUTANEOUS | 0 refills | Status: DC

## 2023-03-14 MED ORDER — FLUCONAZOLE 150 MG PO TABS
150.0000 mg | ORAL_TABLET | ORAL | 0 refills | Status: AC
Start: 2023-03-14 — End: 2023-04-05

## 2023-03-14 NOTE — Assessment & Plan Note (Signed)
Patient notes continued rash in inguinal area.  On exam this area is hyperpigmented.  Will go ahead and do fluconazole once a week for 4 weeks to see if this helps resolve symptoms.  Also discussed using ketoconazole cream as well as nystatin powder after he showers. - If symptoms persist after this we may need to consider referral to dermatology.

## 2023-03-14 NOTE — Progress Notes (Signed)
Established patient visit   Patient: Donald Russell   DOB: [birth year]   37 y.o. Male  MRN: [id number] Visit Date: 03/14/2023  Today's healthcare provider: Charlton Amor, DO   Chief Complaint  Patient presents with   Exposure to STD    Pt is coming in to have STI testing done.    SUBJECTIVE    Chief Complaint  Patient presents with   Exposure to STD    Pt is coming in to have STI testing done.   HPI HPI     Exposure to STD    Additional comments: Pt is coming in to have STI testing done.      Last edited by Roselyn Reef, CMA on 03/14/2023 11:24 AM.      Pt presents for Std testing. He has a new partner and would like to get tested   Rash - pt notes continued rash in inguinal area.  Review of Systems  Constitutional:  Negative for activity change, fatigue and fever.  Respiratory:  Negative for cough and shortness of breath.   Cardiovascular:  Negative for chest pain.  Gastrointestinal:  Negative for abdominal pain.  Genitourinary:  Negative for difficulty urinating.  Skin:  Positive for rash.       Current Meds  Medication Sig   amLODipine (NORVASC) 5 MG tablet TAKE 1 TABLET BY MOUTH DAILY *NEED APPOINTMENT FOR REFILLS*   fluconazole (DIFLUCAN) 150 MG tablet Take 1 tablet (150 mg total) by mouth once a week for 4 doses.   nystatin (MYCOSTATIN/NYSTOP) powder Apply 1 Application topically 3 (three) times daily.    OBJECTIVE    BP (!) 153/87 (BP Location: Left Arm, Patient Position: Sitting, Cuff Size: Large)   Pulse 96   Resp 18   Ht 5\' 5"  (1.651 m)   Wt 196 lb (88.9 kg)   BMI 32.62 kg/m   Physical Exam Vitals and nursing note reviewed.  Constitutional:      General: He is not in acute distress.    Appearance: Normal appearance.  HENT:     Head: Normocephalic and atraumatic.     Right Ear: External ear normal.     Left Ear: External ear normal.     Nose: Nose normal.  Eyes:     Conjunctiva/sclera: Conjunctivae normal.  Cardiovascular:      Rate and Rhythm: Normal rate and regular rhythm.  Pulmonary:     Effort: Pulmonary effort is normal.     Breath sounds: Normal breath sounds.  Skin:    Comments: Inguinal rash hyperpigmented with no signs of erythema. Chaperone present in room  Neurological:     General: No focal deficit present.     Mental Status: He is alert and oriented to person, place, and time.  Psychiatric:        Mood and Affect: Mood normal.        Behavior: Behavior normal.        Thought Content: Thought content normal.        Judgment: Judgment normal.          ASSESSMENT & PLAN    Problem List Items Addressed This Visit       Musculoskeletal and Integument   Tinea cruris    Patient notes continued rash in inguinal area.  On exam this area is hyperpigmented.  Will go ahead and do fluconazole once a week for 4 weeks to see if this helps resolve symptoms.  Also discussed using ketoconazole cream  as well as nystatin powder after he showers. - If symptoms persist after this we may need to consider referral to dermatology.      Relevant Medications   fluconazole (DIFLUCAN) 150 MG tablet   ketoconazole (NIZORAL) 2 % cream     Other   Exposure to STD - Primary    Patient has new partner and would like to be tested for STDs at this time.  He denies any penile discharge or discomfort.      Relevant Orders   Trichomonas vaginalis, RNA   Hepatitis panel, acute   HIV Antibody (routine testing w rflx)   RPR   Chlamydia/Neisseria Gonorrhoeae RNA,TMA,Urogenital   Other Visit Diagnoses     Jock itch       Relevant Medications   fluconazole (DIFLUCAN) 150 MG tablet   ketoconazole (NIZORAL) 2 % cream       No follow-ups on file.      Meds ordered this encounter  Medications   fluconazole (DIFLUCAN) 150 MG tablet    Sig: Take 1 tablet (150 mg total) by mouth once a week for 4 doses.    Dispense:  4 tablet    Refill:  0   ketoconazole (NIZORAL) 2 % cream    Sig: Apply 1 Application  topically 2 (two) times daily. groin    Dispense:  60 g    Refill:  0    Orders Placed This Encounter  Procedures   Trichomonas vaginalis, RNA   Chlamydia/Neisseria Gonorrhoeae RNA,TMA,Urogenital   Hepatitis panel, acute    Order Specific Question:   Release to patient    Answer:   Immediate   HIV Antibody (routine testing w rflx)   RPR     Charlton Amor, DO  [facility] at [facility] [phone] (phone) [phone] (fax)  [facility]

## 2023-03-14 NOTE — Assessment & Plan Note (Signed)
Patient has new partner and would like to be tested for STDs at this time.  He denies any penile discharge or discomfort.

## 2023-03-15 LAB — COMPLETE METABOLIC PANEL WITH GFR
AG Ratio: 1.8 (calc) (ref 1.0–2.5)
ALT: 39 U/L (ref 9–46)
AST: 27 U/L (ref 10–40)
Albumin: 4.9 g/dL (ref 3.6–5.1)
Alkaline phosphatase (APISO): 54 U/L (ref 36–130)
BUN: 14 mg/dL (ref 7–25)
CO2: 25 mmol/L (ref 20–32)
Calcium: 10 mg/dL (ref 8.6–10.3)
Chloride: 100 mmol/L (ref 98–110)
Creat: 0.95 mg/dL (ref 0.60–1.26)
Globulin: 2.8 g/dL (calc) (ref 1.9–3.7)
Glucose, Bld: 78 mg/dL (ref 65–99)
Potassium: 4.2 mmol/L (ref 3.5–5.3)
Sodium: 137 mmol/L (ref 135–146)
Total Bilirubin: 0.9 mg/dL (ref 0.2–1.2)
Total Protein: 7.7 g/dL (ref 6.1–8.1)
eGFR: 107 mL/min/{1.73_m2} (ref >=60)

## 2023-03-15 LAB — LIPID PANEL W/REFLEX DIRECT LDL
Cholesterol: 230 mg/dL — ABNORMAL HIGH (ref <200)
HDL: 46 mg/dL (ref >=40)
LDL Cholesterol (Calc): 143 mg/dL (calc) — ABNORMAL HIGH
Non-HDL Cholesterol (Calc): 184 mg/dL (calc) — ABNORMAL HIGH (ref <130)
Total CHOL/HDL Ratio: 5 (calc) — ABNORMAL HIGH (ref <5.0)
Triglycerides: 269 mg/dL — ABNORMAL HIGH (ref <150)

## 2023-03-15 LAB — CBC WITH DIFFERENTIAL/PLATELET
Absolute Monocytes: 446 cells/uL (ref 200–950)
Basophils Absolute: 87 cells/uL (ref 0–200)
Eosinophils Absolute: 207 cells/uL (ref 15–500)
HCT: 45.2 % (ref 38.5–50.0)
Hemoglobin: 15.3 g/dL (ref 13.2–17.1)
MCH: 32.2 pg (ref 27.0–33.0)
MCHC: 33.8 g/dL (ref 32.0–36.0)
MCV: 95.2 fL (ref 80.0–100.0)
MPV: 10.9 fL (ref 7.5–12.5)
Neutro Abs: 1362 cells/uL — ABNORMAL LOW (ref 1500–7800)
Neutrophils Relative %: 29.6 %
RBC: 4.75 10*6/uL (ref 4.20–5.80)
RDW: 12.8 % (ref 11.0–15.0)

## 2023-03-15 LAB — TSH: TSH: 3.81 mIU/L (ref 0.40–4.50)

## 2023-03-15 LAB — HIV ANTIBODY (ROUTINE TESTING W REFLEX): HIV 1&2 Ab, 4th Generation: NONREACTIVE

## 2023-03-15 LAB — TRICHOMONAS VAGINALIS, PROBE AMP: Trichomonas vaginalis RNA: NOT DETECTED

## 2023-03-15 LAB — HEPATITIS PANEL, ACUTE: Hepatitis C Ab: NONREACTIVE

## 2023-03-17 ENCOUNTER — Encounter: Payer: Self-pay | Admitting: Family Medicine

## 2023-03-18 LAB — CHLAMYDIA/NEISSERIA GONORRHOEAE RNA,TMA,UROGENTIAL
C. trachomatis RNA, TMA: NOT DETECTED
N. gonorrhoeae RNA, TMA: NOT DETECTED

## 2023-03-18 LAB — HEPATITIS PANEL, ACUTE
Hep A IgM: NONREACTIVE
Hep B C IgM: NONREACTIVE
Hepatitis B Surface Ag: NONREACTIVE

## 2023-03-18 LAB — RPR: RPR Ser Ql: NONREACTIVE

## 2023-03-22 ENCOUNTER — Ambulatory Visit: Payer: 59

## 2023-04-10 ENCOUNTER — Encounter (INDEPENDENT_AMBULATORY_CARE_PROVIDER_SITE_OTHER): Payer: 59 | Admitting: Family Medicine

## 2023-04-10 DIAGNOSIS — B356 Tinea cruris: Secondary | ICD-10-CM | POA: Diagnosis not present

## 2023-04-12 MED ORDER — NYSTATIN 100000 UNIT/GM EX POWD: 1.0000 | Freq: Three times a day (TID) | CUTANEOUS | 3 refills | Status: DC

## 2023-04-12 MED ORDER — FLUCONAZOLE 150 MG PO TABS
ORAL_TABLET | ORAL | 0 refills | Status: DC
Start: 2023-04-12 — End: 2023-07-13

## 2023-04-12 MED ORDER — KETOCONAZOLE 2 % EX CREA
1.0000 | TOPICAL_CREAM | Freq: Two times a day (BID) | CUTANEOUS | 0 refills | Status: DC
Start: 2023-04-12 — End: 2023-05-30

## 2023-04-12 NOTE — Telephone Encounter (Signed)
Please see the MyChart message reply(ies) for my assessment and plan.    This patient gave consent for this Medical Advice Message and is aware that it may result in a bill to [employer], as well as the possibility of receiving a bill for a co-payment or deductible. They are an established patient, but are not seeking medical advice exclusively about a problem treated during an in person or video visit in the last seven days. I did not recommend an in person or video visit within seven days of my reply.    I spent a total of 13 minutes cumulative time within 7 days through [employer].  Spent time messaging back and forth and sent in prescripton see mychart messages  Charlton Amor, DO

## 2023-04-12 NOTE — Addendum Note (Signed)
Addended by: Charlton Amor on: 04/12/2023 10:53 AM   Modules accepted: Orders

## 2023-05-08 ENCOUNTER — Ambulatory Visit: Payer: 59 | Admitting: Family Medicine

## 2023-05-10 ENCOUNTER — Ambulatory Visit: Payer: 59 | Admitting: Sports Medicine

## 2023-05-12 ENCOUNTER — Ambulatory Visit: Payer: 59 | Admitting: Family Medicine

## 2023-05-19 ENCOUNTER — Ambulatory Visit: Payer: 59 | Admitting: Sports Medicine

## 2023-05-19 DIAGNOSIS — M5136 Other intervertebral disc degeneration, lumbar region: Secondary | ICD-10-CM | POA: Diagnosis not present

## 2023-05-19 DIAGNOSIS — M51369 Other intervertebral disc degeneration, lumbar region without mention of lumbar back pain or lower extremity pain: Secondary | ICD-10-CM

## 2023-05-19 MED ORDER — MELOXICAM 15 MG PO TABS
ORAL_TABLET | ORAL | 3 refills | Status: DC
Start: 2023-05-19 — End: 2023-09-25

## 2023-05-19 MED ORDER — PREDNISONE 50 MG PO TABS
ORAL_TABLET | ORAL | 0 refills | Status: DC
Start: 2023-05-19 — End: 2023-07-13

## 2023-05-19 NOTE — Assessment & Plan Note (Signed)
37 year old male, increasing low back pain, axial and discogenic, L5-S1 DDD on x-rays. Now with recurrence of pain, we filled out additional FMLA paperwork today. Adding prednisone, Mobic, home PT. Return to see me in 6 weeks, MRI for interventional planning if no better.

## 2023-05-19 NOTE — Progress Notes (Signed)
    Procedures performed today:    None.  Independent interpretation of notes and tests performed by another provider:   None.  Brief History, Exam, Impression, and Recommendations:    Lumbar degenerative disc disease 37 year old male, increasing low back pain, axial and discogenic, L5-S1 DDD on x-rays. Now with recurrence of pain, we filled out additional FMLA paperwork today. Adding prednisone, Mobic, home PT. Return to see me in 6 weeks, MRI for interventional planning if no better.  I spent 30 minutes of total time managing this patient today, this includes chart review, face to face, and non-face to face time.  ____________________________________________ Ihor Austin. Benjamin Stain, M.D., ABFM., CAQSM., AME. Primary Care and Sports Medicine [facility]  Adjunct Professor of Family Medicine  [employer]  Restaurant manager, fast food

## 2023-05-23 ENCOUNTER — Encounter: Payer: Self-pay | Admitting: Pharmacist

## 2023-05-26 ENCOUNTER — Ambulatory Visit: Payer: 59 | Admitting: Family Medicine

## 2023-05-30 ENCOUNTER — Other Ambulatory Visit: Payer: Self-pay | Admitting: Family Medicine

## 2023-05-30 ENCOUNTER — Encounter: Payer: Self-pay | Admitting: Family Medicine

## 2023-05-30 DIAGNOSIS — B356 Tinea cruris: Secondary | ICD-10-CM

## 2023-05-30 MED ORDER — KETOCONAZOLE 2 % EX CREA
1.0000 | TOPICAL_CREAM | Freq: Two times a day (BID) | CUTANEOUS | 0 refills | Status: DC
Start: 2023-05-30 — End: 2023-07-13

## 2023-06-10 ENCOUNTER — Other Ambulatory Visit: Payer: Self-pay | Admitting: Family Medicine

## 2023-06-10 DIAGNOSIS — B356 Tinea cruris: Secondary | ICD-10-CM

## 2023-06-30 ENCOUNTER — Ambulatory Visit: Payer: 59 | Admitting: Sports Medicine

## 2023-07-13 ENCOUNTER — Ambulatory Visit: Payer: 59 | Admitting: Family Medicine

## 2023-07-13 ENCOUNTER — Encounter: Payer: Self-pay | Admitting: Family Medicine

## 2023-07-13 VITALS — BP 128/86 | HR 96 | Ht 66.0 in | Wt 195.0 lb

## 2023-07-13 DIAGNOSIS — B356 Tinea cruris: Secondary | ICD-10-CM | POA: Diagnosis not present

## 2023-07-13 DIAGNOSIS — I10 Essential (primary) hypertension: Secondary | ICD-10-CM | POA: Diagnosis not present

## 2023-07-13 DIAGNOSIS — Z113 Encounter for screening for infections with a predominantly sexual mode of transmission: Secondary | ICD-10-CM | POA: Diagnosis not present

## 2023-07-13 DIAGNOSIS — R21 Rash and other nonspecific skin eruption: Secondary | ICD-10-CM | POA: Diagnosis not present

## 2023-07-13 MED ORDER — KETOCONAZOLE 2 % EX CREA: 1.0000 | TOPICAL_CREAM | Freq: Two times a day (BID) | CUTANEOUS | 3 refills | Status: AC

## 2023-07-13 MED ORDER — NYSTATIN 100000 UNIT/GM EX POWD
1.0000 | Freq: Three times a day (TID) | CUTANEOUS | 3 refills | Status: AC
Start: 2023-07-13 — End: ?

## 2023-07-13 MED ORDER — FLUCONAZOLE 150 MG PO TABS: 150.0000 mg | ORAL_TABLET | ORAL | 0 refills | Status: DC

## 2023-07-13 MED ORDER — METHYLPREDNISOLONE 4 MG PO TBPK: ORAL_TABLET | ORAL | 0 refills | Status: DC

## 2023-07-13 NOTE — Assessment & Plan Note (Signed)
Pt has pustular rash on left antecubital fossa and right leg. Will go ahead and treat with medrol dose pack

## 2023-07-13 NOTE — Progress Notes (Signed)
Acute Office Visit  Subjective:     Patient ID: Donald Russell, male    DOB: [birth year], 37 y.o.   MRN: [id number]  Chief Complaint  Patient presents with   Exposure to STD    Possible STD     Exposure to STD Pertinent negatives include no chest pain, chills, coughing, fever, headaches or shortness of breath.   Patient is in today for concerns of recurring tinea cruris.   HTN - pt has not taken his amlodipine for a few days and just found his pill bottle in the truck - bp today is 165/113   Review of Systems  Constitutional:  Negative for chills and fever.  Respiratory:  Negative for cough and shortness of breath.   Cardiovascular:  Negative for chest pain.  Neurological:  Negative for headaches.        Objective:    BP 128/86 (BP Location: Left Arm, Patient Position: Sitting, Cuff Size: Large)   Pulse 96   Ht 5\' 6"  (1.676 m)   Wt 195 lb (88.5 kg)   SpO2 97%   BMI 31.47 kg/m    Physical Exam Vitals and nursing note reviewed.  Constitutional:      General: He is not in acute distress.    Appearance: Normal appearance.  HENT:     Head: Normocephalic and atraumatic.     Right Ear: External ear normal.     Left Ear: External ear normal.     Nose: Nose normal.  Eyes:     Conjunctiva/sclera: Conjunctivae normal.  Cardiovascular:     Rate and Rhythm: Normal rate and regular rhythm.  Pulmonary:     Effort: Pulmonary effort is normal.     Breath sounds: Normal breath sounds.  Neurological:     General: No focal deficit present.     Mental Status: He is alert and oriented to person, place, and time.  Psychiatric:        Mood and Affect: Mood normal.        Behavior: Behavior normal.        Thought Content: Thought content normal.        Judgment: Judgment normal.     No results found for any visits on 07/13/23.      Assessment & Plan:   Problem List Items Addressed This Visit       Cardiovascular and Mediastinum   Essential hypertension  (Chronic)    Pt presents with uncontrolled htn - had a long discussion with patient about importance of BP control. We discussed side effects of uncontrolled htn including stroke, kidney disease, heart attack, erectile dysfunction  - follow up in 2 weeks for a nurse visit then patient needs to make his appointments with PCP        Musculoskeletal and Integument   Rash    Pt has pustular rash on left antecubital fossa and right leg. Will go ahead and treat with medrol dose pack      Relevant Medications   methylPREDNISolone (MEDROL DOSEPAK) 4 MG TBPK tablet   Tinea cruris - Primary    - will go ahead and do ketoconazole cream and pill  - referral sent to derm since patient is very worried about this and says it effects him a lot - recommend gold bond powder and keeping the area dry      Relevant Medications   fluconazole (DIFLUCAN) 150 MG tablet   nystatin (MYCOSTATIN/NYSTOP) powder   ketoconazole (NIZORAL) 2 % cream  Other Relevant Orders   Ambulatory referral to Dermatology   Other Visit Diagnoses     Screening examination for STD (sexually transmitted disease)       Relevant Orders   HIV antibody (with reflex)   RPR   Chlamydia/Gonococcus/Trichomonas, NAA       Meds ordered this encounter  Medications   fluconazole (DIFLUCAN) 150 MG tablet    Sig: Take 1 tablet (150 mg total) by mouth once a week.    Dispense:  4 tablet    Refill:  0   nystatin (MYCOSTATIN/NYSTOP) powder    Sig: Apply 1 Application topically 3 (three) times daily.    Dispense:  60 g    Refill:  3   methylPREDNISolone (MEDROL DOSEPAK) 4 MG TBPK tablet    Sig: Follow instructions on pill pack    Dispense:  21 tablet    Refill:  0   ketoconazole (NIZORAL) 2 % cream    Sig: Apply 1 Application topically 2 (two) times daily. To affected areas.    Dispense:  60 g    Refill:  3    Return in about 2 weeks (around 07/27/2023) for nurse visit HTN check.  Charlton Amor, DO

## 2023-07-13 NOTE — Assessment & Plan Note (Signed)
-   will go ahead and do ketoconazole cream and pill  - referral sent to derm since patient is very worried about this and says it effects him a lot - recommend gold bond powder and keeping the area dry

## 2023-07-13 NOTE — Assessment & Plan Note (Addendum)
Pt presents with uncontrolled htn - had a long discussion with patient about importance of BP control. We discussed side effects of uncontrolled htn including stroke, kidney disease, heart attack, erectile dysfunction  - follow up in 2 weeks for a nurse visit then patient needs to make his appointments with PCP

## 2023-07-14 ENCOUNTER — Ambulatory Visit: Payer: 59 | Admitting: Family Medicine

## 2023-07-14 LAB — HIV ANTIBODY (ROUTINE TESTING W REFLEX): HIV Screen 4th Generation wRfx: NONREACTIVE

## 2023-07-14 LAB — RPR: RPR Ser Ql: NONREACTIVE

## 2023-07-17 LAB — CHLAMYDIA/GONOCOCCUS/TRICHOMONAS, NAA
Chlamydia by NAA: NEGATIVE
Gonococcus by NAA: NEGATIVE
Trich vag by NAA: NEGATIVE

## 2023-07-28 ENCOUNTER — Ambulatory Visit: Payer: 59

## 2023-08-08 ENCOUNTER — Encounter (INDEPENDENT_AMBULATORY_CARE_PROVIDER_SITE_OTHER): Payer: 59 | Admitting: Sports Medicine

## 2023-08-08 DIAGNOSIS — M5136 Other intervertebral disc degeneration, lumbar region with discogenic back pain only: Secondary | ICD-10-CM | POA: Diagnosis not present

## 2023-08-09 NOTE — Telephone Encounter (Signed)
Please see the MyChart message reply(ies) for my assessment and plan.    This patient gave consent for this Medical Advice Message and is aware that it may result in a bill to their insurance company, as well as the possibility of receiving a bill for a co-payment or deductible. They are an established patient, but are not seeking medical advice exclusively about a problem treated during an in person or video visit in the last seven days. I did not recommend an in person or video visit within seven days of my reply.    I spent a total of 5 minutes cumulative time within 7 days through MyChart messaging.  Clearence Vitug, MD   

## 2023-08-11 ENCOUNTER — Ambulatory Visit: Payer: 59

## 2023-09-01 ENCOUNTER — Ambulatory Visit: Payer: 59

## 2023-09-25 ENCOUNTER — Other Ambulatory Visit: Payer: Self-pay | Admitting: Sports Medicine

## 2023-09-25 DIAGNOSIS — M51369 Other intervertebral disc degeneration, lumbar region without mention of lumbar back pain or lower extremity pain: Secondary | ICD-10-CM

## 2023-10-30 ENCOUNTER — Other Ambulatory Visit: Payer: Self-pay

## 2023-10-30 ENCOUNTER — Encounter (HOSPITAL_COMMUNITY): Payer: Self-pay

## 2023-10-30 ENCOUNTER — Emergency Department (HOSPITAL_COMMUNITY)
Admission: EM | Admit: 2023-10-30 | Discharge: 2023-10-30 | Disposition: A | Discharge status: Home / Self Care | Payer: 59 | Attending: Emergency Medicine | Admitting: Emergency Medicine

## 2023-10-30 DIAGNOSIS — G5631 Lesion of radial nerve, right upper limb: Secondary | ICD-10-CM | POA: Diagnosis not present

## 2023-10-30 DIAGNOSIS — I10 Essential (primary) hypertension: Secondary | ICD-10-CM | POA: Insufficient documentation

## 2023-10-30 DIAGNOSIS — R200 Anesthesia of skin: Secondary | ICD-10-CM | POA: Diagnosis present

## 2023-10-30 MED ORDER — IBUPROFEN 400 MG PO TABS
400.0000 mg | ORAL_TABLET | Freq: Once | ORAL | Status: AC
Start: 2023-10-30 — End: 2023-10-30
  Administered 2023-10-30: 400 mg via ORAL
  Filled 2023-10-30: qty 1

## 2023-10-30 NOTE — ED Provider Notes (Signed)
 [place] EMERGENCY DEPARTMENT AT [facility] Provider Note   CSN: [id number] Arrival date & time: 10/30/23  0909     History  Chief Complaint  Patient presents with   Numbness    Donald Russell is a 38 y.o. male.  Patient is a 38 year old male with a past medical history of hypertension presenting to the emergency department with elbow pain and numbness to his fingers.  The patient states that he woke up this morning with a burning type of pain around his right elbow.  He states that his first 2 fingers feel numb.  He states that his arm does not feel weak but feels like he does not want to use it because of the numbness.  He states something similar to this happen to him several months ago.  He denies any trauma or falls.  He denies any neck pain or numbness or weakness anywhere else.  The history is provided by the patient.       Home Medications Prior to Admission medications   Medication Sig Start Date End Date Taking? Authorizing Provider  amLODipine  (NORVASC ) 5 MG tablet TAKE 1 TABLET BY MOUTH DAILY *NEED APPOINTMENT FOR REFILLS* 02/23/23   Adela Holter, DO  fluconazole  (DIFLUCAN ) 150 MG tablet Take 1 tablet (150 mg total) by mouth once a week. 07/13/23   Josepha Nickels, DO  ketoconazole  (NIZORAL ) 2 % cream Apply 1 Application topically 2 (two) times daily. To affected areas. 07/13/23   Josepha Nickels, DO  meloxicam  (MOBIC ) 15 MG tablet ONE TAB BY MOUTH EVERY 24 HOURS WITH A MEAL FOR 2 WEEKS, THEN ONCE EVERY 24 HOURS AS NEEDED FOR PAIN 09/25/23   Gean Keels, MD  methylPREDNISolone  (MEDROL  DOSEPAK) 4 MG TBPK tablet Follow instructions on pill pack 07/13/23   Josepha Nickels, DO  nystatin  (MYCOSTATIN /NYSTOP ) powder Apply 1 Application topically 3 (three) times daily. 07/13/23   Josepha Nickels, DO  escitalopram  (LEXAPRO ) 10 MG tablet Take 1/2 tablet (5mg ) daily x 8 days then increase to 1 tablet (10mg ) daily. 10/14/20 02/04/21  Adela Holter, DO       Allergies    Patient has no known allergies.    Review of Systems   Review of Systems  Physical Exam Updated Vital Signs BP (!) 143/92 (BP Location: Left Arm)   Pulse 87   Temp 98.1 F (36.7 C) (Oral)   Resp 16   Ht 5\' 6"  (1.676 m)   Wt 90.7 kg   SpO2 98%   BMI 32.28 kg/m  Physical Exam Vitals and nursing note reviewed.  Constitutional:      General: He is not in acute distress.    Appearance: Normal appearance.  HENT:     Head: Normocephalic and atraumatic.     Nose: Nose normal.     Mouth/Throat:     Mouth: Mucous membranes are moist.  Eyes:     Extraocular Movements: Extraocular movements intact.     Conjunctiva/sclera: Conjunctivae normal.  Neck:     Comments: No midline neck tenderness  Negative spurling's test Cardiovascular:     Rate and Rhythm: Normal rate and regular rhythm.     Pulses: Normal pulses.  Pulmonary:     Effort: Pulmonary effort is normal.  Abdominal:     General: Abdomen is flat.  Musculoskeletal:        General: Normal range of motion.     Cervical back: Normal range of motion and neck supple.  Comments: Negative tinel's test of the R elbow No bony tenderness to palpation in RUE, no deformities  Skin:    General: Skin is warm and dry.  Neurological:     Mental Status: He is alert and oriented to person, place, and time.     Sensory: Sensory deficit (Decreased sensation on R 2nd and 3rd digits, decreased sensation on radial aspect of R forearm. Remainder of sensation in RUE, LUE and bilateral LE intact) present.     Motor: Weakness (4/5 strength in R hand finger abduction, 5/5 bilateral grip strength, crossed fingers and "ok" sign intact, wrist flexion intact) present.  Psychiatric:        Mood and Affect: Mood normal.        Behavior: Behavior normal.     ED Results / Procedures / Treatments   Labs (all labs ordered are listed, but only abnormal results are displayed) Labs Reviewed - No data to  display  EKG None  Radiology No results found.  Procedures Procedures    Medications Ordered in ED Medications  ibuprofen  (ADVIL ) tablet 400 mg (has no administration in time range)    ED Course/ Medical Decision Making/ A&P                                 Medical Decision Making This patient presents to the ED with chief complaint(s) of RUE pain/numbness with pertinent past medical history of HTN which further complicates the presenting complaint. The complaint involves an extensive differential diagnosis and also carries with it a high risk of complications and morbidity.    The differential diagnosis includes peripheral neuropathy, radial nerve compression, no evidence of cervical radiculopathy on exam, numbness in single nerve distribution without other neurologic deficits making CVA unlikely   Additional history obtained: Additional history obtained from N/A Records reviewed Primary Care Documents  ED Course and Reassessment: On patient's arrival he is hemodynamically stable in no acute distress.  Does have decreased sensation on second to digits and radial aspect of right forearm consistent with a peripheral radial neuropathy.  Suspect likely related to patient's sleeping position.  He has no evidence of wrist drop on exam.  Based on his pain and symptoms suspect compression near the elbow.  He was recommended sleeping with an elbow brace, NSAIDs and will be given outpatient hand follow-up.  He was given strict return precautions.  Independent labs interpretation:  N/A  Independent visualization of imaging: - N/A  Consultation: - Consulted or discussed management/test interpretation w/ external professional: N/A  Consideration for admission or further workup: Patient has no emergent conditions requiring admission or further work-up at this time and is stable for discharge home with primary care and ortho hand follow-up  Social Determinants of health:  N/A    Risk Prescription drug management.          Final Clinical Impression(s) / ED Diagnoses Final diagnoses:  Radial neuropathy, right    Rx / DC Orders ED Discharge Orders     None         Kingsley, Trung Wenzl K, DO 10/30/23 [phone]

## 2023-10-30 NOTE — Discharge Instructions (Signed)
 You were seen in the emergency department for your elbow pain and numbness of your hand.  This is due to a nerve compression in your arm, likely at the level of your elbow with where your pain is.  You should get an elbow splint called a cubital tunnel brace or elbow brace and this can be purchased at any drugstore, [employer] or [employer] and sleep with this to help keep your elbow straight to reduce pressure on your nerve at night.  You can ice your elbow and keep your arm elevated to help with any swelling and you can take NSAIDs such as ibuprofen  as needed for pain.  You can follow-up with your primary doctor as well as the orthopedic hand doctor to have your symptoms rechecked and to monitor for any permanent nerve compression.  You should return to the emergency department if you are having numbness or weakness on 1 whole side of your body compared to the other, or if you have any other new or concerning symptoms.

## 2023-10-30 NOTE — ED Triage Notes (Signed)
 Pt BIB EMS, woke up at 0400 for right elbow pain and states arm was numb. Hx of same. Pt non complaint with HTN meds. 156/92 BP with EMS. Axox4.

## 2023-11-05 ENCOUNTER — Other Ambulatory Visit: Payer: Self-pay | Admitting: Family Medicine

## 2023-11-05 DIAGNOSIS — I10 Essential (primary) hypertension: Secondary | ICD-10-CM

## 2023-11-07 NOTE — Telephone Encounter (Signed)
Pls contact pt to schedule HTN appt with Dr. Ashley Royalty. Sending 30 day refill. Thanks. No additional refill without kept appt.

## 2023-11-08 NOTE — Telephone Encounter (Signed)
Called patient, no answer, VM full. Will call back later. Thanks.

## 2023-12-11 ENCOUNTER — Other Ambulatory Visit: Payer: Self-pay | Admitting: Family Medicine

## 2023-12-11 DIAGNOSIS — I10 Essential (primary) hypertension: Secondary | ICD-10-CM

## 2023-12-11 NOTE — Telephone Encounter (Signed)
 Pls contact pt to schedule appt with Dr. Ashley Royalty for HTN. Unable to refill meds. Thanks

## 2024-01-26 ENCOUNTER — Telehealth (INDEPENDENT_AMBULATORY_CARE_PROVIDER_SITE_OTHER): Admitting: Family Medicine

## 2024-01-26 ENCOUNTER — Encounter: Payer: Self-pay | Admitting: Family Medicine

## 2024-01-26 DIAGNOSIS — I10 Essential (primary) hypertension: Secondary | ICD-10-CM | POA: Diagnosis not present

## 2024-01-26 MED ORDER — AMLODIPINE BESYLATE 5 MG PO TABS
ORAL_TABLET | ORAL | 1 refills | Status: AC
Start: 2024-01-26 — End: ?

## 2024-01-26 NOTE — Progress Notes (Signed)
 [name] - 38 y.o. male MRN [id number]  Date of birth: 04/28/1986   All issues noted in this document were discussed and addressed.  No physical exam was performed (except for noted visual exam findings with Video Visits).  I discussed the limitations of evaluation and management by telemedicine and the availability of in person appointments. The patient expressed understanding and agreed to proceed.  I connected withNAME@ on 01/26/24 at 10:10 AM EDT by a video enabled telemedicine application and verified that I am speaking with the correct person using two identifiers.  Present at visit: Adela Holter, DO Sharri Dee   Patient Location: Home [address] [place] [address]   Provider location:   [facility]  Chief Complaint  Patient presents with   Hypertension    HPI  Donald Russell is a 38 y.o. male who presents via audio/video conferencing for a telehealth visit today.  He is following up for HTN.  He has been out of his medication for about 1.5 months and BP has been running high.  Denies symptoms including chest pain, shortness of breath, palpitations, headache or vision changes.    ROS:  A comprehensive ROS was completed and negative except as noted per HPI  Past Medical History:  Diagnosis Date   Hyperlipidemia    Hypertension    Thyroid  disease     No past surgical history on file.  Family History  Problem Relation Age of Onset   Hypertension Mother    Hypertension Brother    Kidney disease Brother    Alcohol abuse Neg Hx    Cancer Neg Hx    Diabetes Neg Hx    Early death Neg Hx    Hearing loss Neg Hx    Heart disease Neg Hx    Hyperlipidemia Neg Hx    Stroke Neg Hx     Social History   Socioeconomic History   Marital status: Single    Spouse name: Not on file   Number of children: Not on file   Years of education: Not on file   Highest education level: Not on file  Occupational History   Not on file  Tobacco Use   Smoking  status: Former    Current packs/day: 0.25    Average packs/day: 0.3 packs/day for 3.0 years (0.8 ttl pk-yrs)    Types: Cigarettes   Smokeless tobacco: Never  Vaping Use   Vaping status: Never Used  Substance and Sexual Activity   Alcohol use: Yes    Alcohol/week: 7.0 standard drinks of alcohol    Types: 7 Cans of beer per week    Comment: weekly   Drug use: No   Sexual activity: Yes  Other Topics Concern   Not on file  Social History Narrative   Not on file   Social Drivers of Health   Financial Resource Strain: Not on file  Food Insecurity: Not on file  Transportation Needs: Not on file  Physical Activity: Not on file  Stress: Not on file  Social Connections: Not on file  Intimate Partner Violence: Not on file     Current Outpatient Medications:    amLODipine  (NORVASC ) 5 MG tablet, TAKE 1 TABLET BY MOUTH DAILY, Disp: 90 tablet, Rfl: 1   fluconazole  (DIFLUCAN ) 150 MG tablet, Take 1 tablet (150 mg total) by mouth once a week., Disp: 4 tablet, Rfl: 0   ketoconazole  (NIZORAL ) 2 % cream, Apply 1 Application topically 2 (two) times daily. To  affected areas., Disp: 60 g, Rfl: 3   meloxicam  (MOBIC ) 15 MG tablet, ONE TAB BY MOUTH EVERY 24 HOURS WITH A MEAL FOR 2 WEEKS, THEN ONCE EVERY 24 HOURS AS NEEDED FOR PAIN, Disp: 30 tablet, Rfl: 3   methylPREDNISolone  (MEDROL  DOSEPAK) 4 MG TBPK tablet, Follow instructions on pill pack, Disp: 21 tablet, Rfl: 0   nystatin  (MYCOSTATIN /NYSTOP ) powder, Apply 1 Application topically 3 (three) times daily., Disp: 60 g, Rfl: 3  EXAM:  VITALS per patient if applicable: Ht 5\' 6"  (1.676 m)   Wt 200 lb (90.7 kg)   BMI 32.28 kg/m   GENERAL: alert, oriented, appears well and in no acute distress  HEENT: atraumatic, conjunttiva clear, no obvious abnormalities on inspection of external nose and ears  NECK: normal movements of the head and neck  LUNGS: on inspection no signs of respiratory distress, breathing rate appears normal, no obvious gross  SOB, gasping or wheezing  CV: no obvious cyanosis  MS: moves all visible extremities without noticeable abnormality  PSYCH/NEURO: pleasant and cooperative, no obvious depression or anxiety, speech and thought processing grossly intact  ASSESSMENT AND PLAN:  Discussed the following assessment and plan:  Essential hypertension Restart amlodipine .  Check BP at home.  Low sodium diet encouraged.       I discussed the assessment and treatment plan with the patient. The patient was provided an opportunity to ask questions and all were answered. The patient agreed with the plan and demonstrated an understanding of the instructions.   The patient was advised to call back or seek an in-person evaluation if the symptoms worsen or if the condition fails to improve as anticipated.    Adela Holter, DO

## 2024-01-26 NOTE — Assessment & Plan Note (Signed)
 Restart amlodipine .  Check BP at home.  Low sodium diet encouraged.

## 2024-04-08 ENCOUNTER — Ambulatory Visit: Payer: Self-pay

## 2024-04-08 NOTE — Telephone Encounter (Signed)
 FYI Only or Action Required?: FYI only for provider.  Patient was last seen in primary care on 01/26/2024 by Alvia Bring, DO.  Called Nurse Triage reporting Back Pain.  Symptoms began a week ago.  Interventions attempted: OTC medications: with some relief.  Symptoms are: unchanged.  Triage Disposition: See PCP When Office is Open (Within 3 Days)  Patient/caregiver understands and will follow disposition?: Yes      Copied from CRM [id number]. Topic: Clinical - Red Word Triage >> Apr 08, 2024  9:17 AM Laurier BROCKS wrote: Red Word that prompted transfer to Nurse Triage: Patient has been having a sharp pain in his back that comes and goes. Patient says the pain can get to a 7 at times Reason for Disposition  [1] MODERATE back pain (e.g., interferes with normal activities) AND [2] present > 3 days  Answer Assessment - Initial Assessment Questions 1. ONSET: When did the pain begin? (e.g., minutes, hours, days)     Last week 2. LOCATION: Where does it hurt? (upper, mid or lower back)     lower 3. SEVERITY: How bad is the pain?  (e.g., Scale 1-10; mild, moderate, or severe)     7/10 Ibuprofen  with some relief 4. PaTTERN: Is the pain constant? (e.g., yes, no; constant, intermittent)      Comes and goes 5. RADIATION: Does the pain shoot into your legs or somewhere else?     denies 6. CAUSE:  What do you think is causing the back pain?      unknown 7. BACK OVERUSE:  Any recent lifting of heavy objects, strenuous work or exercise?     Pt endorses working in [employer] so possible overuse 8. MEDICINES: What have you taken so far for the pain? (e.g., nothing, acetaminophen, NSAIDS)     ibuprofen  9. NEUROLOGIC SYMPTOMS: Do you have any weakness, numbness, or problems with bowel/bladder control?     denies 10. OTHER SYMPTOMS: Do you have any other symptoms? (e.g., fever, abdomen pain, burning with urination, blood in urine)       Endorses having night sweats Denies  other sx 11. PREGNANCY: Is there any chance you are pregnant? When was your last menstrual period?       N/a  Protocols used: Back Pain-A-AH

## 2024-04-10 ENCOUNTER — Encounter: Payer: Self-pay | Admitting: Family Medicine

## 2024-04-10 ENCOUNTER — Ambulatory Visit: Admitting: Family Medicine

## 2024-04-10 VITALS — BP 151/96 | HR 81 | Ht 66.0 in | Wt 200.0 lb

## 2024-04-10 DIAGNOSIS — Z202 Contact with and (suspected) exposure to infections with a predominantly sexual mode of transmission: Secondary | ICD-10-CM

## 2024-04-10 DIAGNOSIS — R109 Unspecified abdominal pain: Secondary | ICD-10-CM | POA: Insufficient documentation

## 2024-04-10 LAB — POCT URINALYSIS DIP (CLINITEK)
Bilirubin, UA: NEGATIVE
Blood, UA: NEGATIVE
Glucose, UA: NEGATIVE mg/dL
Ketones, POC UA: NEGATIVE mg/dL
Leukocytes, UA: NEGATIVE
Nitrite, UA: NEGATIVE
POC PROTEIN,UA: NEGATIVE
Spec Grav, UA: 1.01 (ref 1.010–1.025)
Urobilinogen, UA: 1 U/dL
pH, UA: 6 (ref 5.0–8.0)

## 2024-04-10 NOTE — Assessment & Plan Note (Signed)
 Orders Placed This Encounter  Procedures   Chlamydia/Gonococcus/Trichomonas, NAA   Mycoplasma / Ureaplasma Culture   HIV antibody (with reflex)   RPR   CMP14+EGFR   POCT URINALYSIS DIP (CLINITEK)

## 2024-04-10 NOTE — Assessment & Plan Note (Signed)
 Normal UA.  This is resolved at this point. He will let me know if this returns.

## 2024-04-10 NOTE — Progress Notes (Signed)
 Donald Russell - 38 y.o. male MRN [id number]  Date of birth: 07/31/1986  Subjective Chief Complaint  Patient presents with   Hypertension   Flank Pain   Exposure to STD    HPI Donald Russell is a 38 y.o. male here today with complaint of R flank pain.  He had R flank pain last week that has been resolved for the past few days.  He does report working outdoors in the heat and possibly being a little dehydrated.  Denies radiation of pain.    He wants to have STI testing.  He has not had any symptoms that he knows about but had possible exposure.  Denies testicular pain, dysuria, frequency or penile dishcarge.   ROS:  A comprehensive ROS was completed and negative except as noted per HPI  No Known Allergies  Past Medical History:  Diagnosis Date   Hyperlipidemia    Hypertension    Thyroid  disease     No past surgical history on file.  Social History   Socioeconomic History   Marital status: Single    Spouse name: Not on file   Number of children: Not on file   Years of education: Not on file   Highest education level: Not on file  Occupational History   Not on file  Tobacco Use   Smoking status: Former    Current packs/day: 0.25    Average packs/day: 0.3 packs/day for 3.0 years (0.8 ttl pk-yrs)    Types: Cigarettes   Smokeless tobacco: Never  Vaping Use   Vaping status: Never Used  Substance and Sexual Activity   Alcohol use: Yes    Alcohol/week: 7.0 standard drinks of alcohol    Types: 7 Cans of beer per week    Comment: weekly   Drug use: No   Sexual activity: Yes  Other Topics Concern   Not on file  Social History Narrative   Not on file   Social Drivers of Health   Financial Resource Strain: Not on file  Food Insecurity: Not on file  Transportation Needs: Not on file  Physical Activity: Not on file  Stress: Not on file  Social Connections: Not on file    Family History  Problem Relation Age of Onset   Hypertension Mother    Hypertension  Brother    Kidney disease Brother    Alcohol abuse Neg Hx    Cancer Neg Hx    Diabetes Neg Hx    Early death Neg Hx    Hearing loss Neg Hx    Heart disease Neg Hx    Hyperlipidemia Neg Hx    Stroke Neg Hx     Health Maintenance  Topic Date Due   Hepatitis B Vaccines (1 of 3 - 19+ 3-dose series) Never done   HPV VACCINES (1 - 3-dose SCDM series) Never done   COVID-19 Vaccine (1 - 2024-25 season) Never done   INFLUENZA VACCINE  04/19/2024   DTaP/Tdap/Td (3 - Td or Tdap) 07/05/2028   Hepatitis C Screening  Completed   HIV Screening  Completed   Meningococcal B Vaccine  Aged Out     ----------------------------------------------------------------------------------------------------------------------------------------------------------------------------------------------------------------- Physical Exam BP (!) 151/96 (BP Location: Left Arm, Patient Position: Sitting, Cuff Size: Large)   Pulse 81   Ht 5' 6 (1.676 m)   Wt 200 lb (90.7 kg)   SpO2 100%   BMI 32.28 kg/m   Physical Exam Constitutional:      Appearance: Normal appearance.  Eyes:  General: No scleral icterus. Pulmonary:     Effort: Pulmonary effort is normal.     Breath sounds: Normal breath sounds.  Abdominal:     Palpations: Abdomen is soft.     Tenderness: There is no right CVA tenderness or left CVA tenderness.  Musculoskeletal:     Cervical back: Neck supple.  Neurological:     General: No focal deficit present.     Mental Status: He is alert.  Psychiatric:        Mood and Affect: Mood normal.        Behavior: Behavior normal.     ------------------------------------------------------------------------------------------------------------------------------------------------------------------------------------------------------------------- Assessment and Plan  Acute right flank pain Normal UA.  This is resolved at this point. He will let me know if this returns.   Exposure to STD Orders  Placed This Encounter  Procedures   Chlamydia/Gonococcus/Trichomonas, NAA   Mycoplasma / Ureaplasma Culture   HIV antibody (with reflex)   RPR   CMP14+EGFR   POCT URINALYSIS DIP (CLINITEK)     No orders of the defined types were placed in this encounter.   No follow-ups on file.

## 2024-04-11 LAB — CMP14+EGFR
ALT: 37 IU/L (ref 0–44)
AST: 28 IU/L (ref 0–40)
Albumin: 4.8 g/dL (ref 4.1–5.1)
Alkaline Phosphatase: 64 IU/L (ref 44–121)
BUN/Creatinine Ratio: 10 (ref 9–20)
BUN: 10 mg/dL (ref 6–20)
Bilirubin Total: 0.8 mg/dL (ref 0.0–1.2)
CO2: 21 mmol/L (ref 20–29)
Calcium: 9.6 mg/dL (ref 8.7–10.2)
Chloride: 99 mmol/L (ref 96–106)
Creatinine, Ser: 0.99 mg/dL (ref 0.76–1.27)
Globulin, Total: 2.9 g/dL (ref 1.5–4.5)
Glucose: 88 mg/dL (ref 70–99)
Potassium: 4.2 mmol/L (ref 3.5–5.2)
Sodium: 137 mmol/L (ref 134–144)
Total Protein: 7.7 g/dL (ref 6.0–8.5)
eGFR: 101 mL/min/1.73 (ref >59)

## 2024-04-11 LAB — RPR: RPR Ser Ql: NONREACTIVE

## 2024-04-11 LAB — HIV ANTIBODY (ROUTINE TESTING W REFLEX): HIV Screen 4th Generation wRfx: NONREACTIVE

## 2024-04-13 LAB — CHLAMYDIA/GONOCOCCUS/TRICHOMONAS, NAA
Chlamydia by NAA: NEGATIVE
Gonococcus by NAA: NEGATIVE
Trich vag by NAA: NEGATIVE

## 2024-04-14 ENCOUNTER — Ambulatory Visit: Payer: Self-pay | Admitting: Family Medicine

## 2024-04-18 MED ORDER — DOXYCYCLINE HYCLATE 100 MG PO TABS: 100.0000 mg | ORAL_TABLET | Freq: Two times a day (BID) | ORAL | 0 refills | Status: AC

## 2024-04-19 LAB — MYCOPLASMA / UREAPLASMA CULTURE
Mycoplasma hominis Culture: POSITIVE — AB
Ureaplasma urealyticum: POSITIVE — AB

## 2024-04-19 LAB — SPECIMEN STATUS REPORT

## 2024-05-14 ENCOUNTER — Encounter: Payer: Self-pay | Admitting: Family Medicine

## 2024-05-21 ENCOUNTER — Ambulatory Visit: Admitting: Family Medicine

## 2024-05-21 ENCOUNTER — Encounter: Payer: Self-pay | Admitting: Family Medicine

## 2024-05-21 ENCOUNTER — Encounter: Payer: Self-pay | Admitting: Sports Medicine

## 2024-05-21 VITALS — BP 137/85 | HR 70 | Ht 66.0 in | Wt 202.0 lb

## 2024-05-21 DIAGNOSIS — I10 Essential (primary) hypertension: Secondary | ICD-10-CM

## 2024-05-21 DIAGNOSIS — M5136 Other intervertebral disc degeneration, lumbar region with discogenic back pain only: Secondary | ICD-10-CM

## 2024-05-21 DIAGNOSIS — B356 Tinea cruris: Secondary | ICD-10-CM | POA: Diagnosis not present

## 2024-05-21 MED ORDER — FLUCONAZOLE 150 MG PO TABS: 150.0000 mg | ORAL_TABLET | ORAL | 0 refills | Status: AC

## 2024-05-21 NOTE — Assessment & Plan Note (Signed)
 Completed updated FMLA paperwork.   Continue mobid prn.  Continue home exercises.

## 2024-05-21 NOTE — Assessment & Plan Note (Signed)
 Bp is well controlled with current medication.  Will plan to continue.  Encouraged low sodium intake.

## 2024-05-21 NOTE — Assessment & Plan Note (Signed)
 Once weekly fluconazole  x 4 weeks.

## 2024-05-21 NOTE — Progress Notes (Signed)
 Donald Russell - 38 y.o. male MRN [id number]  Date of birth: 1986/05/17  Subjective Chief Complaint  Patient presents with   Hypertension   FMLA    HPI Donald Russell is a 38 y.o. male here today for follow up visit.   He was previously seen by Dr. Curtis for lumbar DDD.   Xrays with DDD at L5-S1.  He has had FMLA on file to use prn for flares of his back pain and needs this renewed.  He has flares 3-4x per month.  These last 1-2 days per episode.  Improves with stretching and rest.   His job requires him to be fairly active and can be strenuous.  He denies new symptoms associated   ROS:  A comprehensive ROS was completed and negative except as noted per HPI  No Known Allergies  Past Medical History:  Diagnosis Date   Hyperlipidemia    Hypertension    Thyroid  disease     History reviewed. No pertinent surgical history.  Social History   Socioeconomic History   Marital status: Single    Spouse name: Not on file   Number of children: Not on file   Years of education: Not on file   Highest education level: Not on file  Occupational History   Not on file  Tobacco Use   Smoking status: Former    Current packs/day: 0.25    Average packs/day: 0.3 packs/day for 3.0 years (0.8 ttl pk-yrs)    Types: Cigarettes   Smokeless tobacco: Never  Vaping Use   Vaping status: Never Used  Substance and Sexual Activity   Alcohol use: Yes    Alcohol/week: 7.0 standard drinks of alcohol    Types: 7 Cans of beer per week    Comment: weekly   Drug use: No   Sexual activity: Yes  Other Topics Concern   Not on file  Social History Narrative   Not on file   Social Drivers of Health   Financial Resource Strain: Low Risk  (04/10/2024)   Overall Financial Resource Strain (CARDIA)    Difficulty of Paying Living Expenses: Not very hard  Food Insecurity: No Food Insecurity (04/10/2024)   Hunger Vital Sign    Worried About Running Out of Food in the Last Year: Never true    Ran  Out of Food in the Last Year: Never true  Transportation Needs: No Transportation Needs (04/10/2024)   PRAPARE - Administrator, Civil Service (Medical): No    Lack of Transportation (Non-Medical): No  Physical Activity: Sufficiently Active (04/10/2024)   Exercise Vital Sign    Days of Exercise per Week: 5 days    Minutes of Exercise per Session: 60 min  Stress: No Stress Concern Present (04/10/2024)   [employer] of Occupational Health - Occupational Stress Questionnaire    Feeling of Stress: Not at all  Social Connections: Moderately Integrated (04/10/2024)   Social Connection and Isolation Panel    Frequency of Communication with Friends and Family: Twice a week    Frequency of Social Gatherings with Friends and Family: Once a week    Attends Religious Services: 1 to 4 times per year    Active Member of [employer] or Organizations: No    Attends Banker Meetings: Never    Marital Status: Living with partner    Family History  Problem Relation Age of Onset   Hypertension Mother    Hypertension Brother    Kidney disease  Brother    Alcohol abuse Neg Hx    Cancer Neg Hx    Diabetes Neg Hx    Early death Neg Hx    Hearing loss Neg Hx    Heart disease Neg Hx    Hyperlipidemia Neg Hx    Stroke Neg Hx     Health Maintenance  Topic Date Due   Hepatitis B Vaccines 19-59 Average Risk (1 of 3 - 19+ 3-dose series) Never done   HPV VACCINES (1 - 3-dose SCDM series) Never done   COVID-19 Vaccine (1 - 2024-25 season) 07/20/2024 (Originally 05/20/2024)   INFLUENZA VACCINE  12/17/2024 (Originally 04/19/2024)   DTaP/Tdap/Td (3 - Td or Tdap) 07/05/2028   Hepatitis C Screening  Completed   HIV Screening  Completed   Pneumococcal Vaccine  Aged Out   Meningococcal B Vaccine  Aged Out      ----------------------------------------------------------------------------------------------------------------------------------------------------------------------------------------------------------------- Physical Exam BP 137/85 (BP Location: Left Arm, Patient Position: Sitting, Cuff Size: Large)   Pulse 70   Ht 5' 6 (1.676 m)   Wt 202 lb (91.6 kg)   SpO2 99%   BMI 32.60 kg/m   Physical Exam Constitutional:      Appearance: Normal appearance.  Cardiovascular:     Rate and Rhythm: Normal rate and regular rhythm.  Pulmonary:     Effort: Pulmonary effort is normal.     Breath sounds: Normal breath sounds.  Neurological:     Mental Status: He is alert.  Psychiatric:        Mood and Affect: Mood normal.        Behavior: Behavior normal.     ------------------------------------------------------------------------------------------------------------------------------------------------------------------------------------------------------------------- Assessment and Plan  Lumbar degenerative disc disease Completed updated FMLA paperwork.   Continue mobid prn.  Continue home exercises.    Essential hypertension Bp is well controlled with current medication.  Will plan to continue.  Encouraged low sodium intake.    Tinea cruris Once weekly fluconazole  x 4 weeks.    Meds ordered this encounter  Medications   fluconazole  (DIFLUCAN ) 150 MG tablet    Sig: Take 1 tablet (150 mg total) by mouth once a week.    Dispense:  4 tablet    Refill:  0    No follow-ups on file.

## 2024-07-01 ENCOUNTER — Other Ambulatory Visit: Payer: Self-pay | Admitting: Nurse Practitioner

## 2024-07-01 ENCOUNTER — Ambulatory Visit: Admitting: Family Medicine

## 2024-07-01 ENCOUNTER — Encounter: Payer: Self-pay | Admitting: Family Medicine

## 2024-07-01 ENCOUNTER — Ambulatory Visit
Admission: RE | Admit: 2024-07-01 | Discharge: 2024-07-01 | Disposition: A | Discharge status: Home / Self Care | Payer: Worker's Compensation | Source: Ambulatory Visit | Attending: Nurse Practitioner | Admitting: Nurse Practitioner

## 2024-07-01 VITALS — BP 131/80 | HR 74 | Ht 66.0 in | Wt 209.0 lb

## 2024-07-01 DIAGNOSIS — M5416 Radiculopathy, lumbar region: Secondary | ICD-10-CM

## 2024-07-01 DIAGNOSIS — M549 Dorsalgia, unspecified: Secondary | ICD-10-CM

## 2024-07-01 MED ORDER — PREDNISONE 10 MG (48) PO TBPK: ORAL_TABLET | Freq: Every day | ORAL | 0 refills | Status: AC

## 2024-07-01 NOTE — Progress Notes (Signed)
 Donald Russell - 38 y.o. male MRN [id number]  Date of birth: 10/12/1985  Subjective Chief Complaint  Patient presents with   Back Pain    HPI Donald Russell is a 38 y.o. male with complaint of back pain.  Reports injuring his back while at work recently.  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  He was seen at clinic that his employer has.  X-ray was ordered at this clinic.  He had this completed this morning.  He continues to have difficulty with pain.  He does have radiation of pain into the right lower extremity.  No numbness or tingling.  No bowel or bladder incontinence.  ROS:  A comprehensive ROS was completed and negative except as noted per HPI    No Known Allergies  Past Medical History:  Diagnosis Date   Hyperlipidemia    Hypertension    Thyroid  disease     History reviewed. No pertinent surgical history.  Social History   Socioeconomic History   Marital status: Single    Spouse name: Not on file   Number of children: Not on file   Years of education: Not on file   Highest education level: Not on file  Occupational History   Not on file  Tobacco Use   Smoking status: Former    Current packs/day: 0.25    Average packs/day: 0.3 packs/day for 3.0 years (0.8 ttl pk-yrs)    Types: Cigarettes   Smokeless tobacco: Never  Vaping Use   Vaping status: Never Used  Substance and Sexual Activity   Alcohol use: Yes    Alcohol/week: 7.0 standard drinks of alcohol    Types: 7 Cans of beer per week    Comment: weekly   Drug use: No   Sexual activity: Yes  Other Topics Concern   Not on file  Social History Narrative   Not on file   Social Drivers of Health   Financial Resource Strain: Low Risk  (04/10/2024)   Overall Financial Resource Strain (CARDIA)    Difficulty of Paying Living Expenses: Not very hard  Food Insecurity: No Food Insecurity (04/10/2024)   Hunger Vital Sign     Worried About Running Out of Food in the Last Year: Never true    Ran Out of Food in the Last Year: Never true  Transportation Needs: No Transportation Needs (04/10/2024)   PRAPARE - Administrator, Civil Service (Medical): No    Lack of Transportation (Non-Medical): No  Physical Activity: Sufficiently Active (04/10/2024)   Exercise Vital Sign    Days of Exercise per Week: 5 days    Minutes of Exercise per Session: 60 min  Stress: No Stress Concern Present (04/10/2024)   [employer] of Occupational Health - Occupational Stress Questionnaire    Feeling of Stress: Not at all  Social Connections: Moderately Integrated (04/10/2024)   Social Connection and Isolation Panel    Frequency of Communication with Friends and Family: Twice a week    Frequency of Social Gatherings with Friends and Family: Once a week    Attends Religious Services: 1 to 4 times per year    Active Member of [employer] or Organizations: No    Attends Banker Meetings: Never    Marital Status: Living with partner    Family History  Problem Relation Age of Onset   Hypertension Mother    Hypertension  Brother    Kidney disease Brother    Alcohol abuse Neg Hx    Cancer Neg Hx    Diabetes Neg Hx    Early death Neg Hx    Hearing loss Neg Hx    Heart disease Neg Hx    Hyperlipidemia Neg Hx    Stroke Neg Hx     Health Maintenance  Topic Date Due   Hepatitis B Vaccines 19-59 Average Risk (1 of 3 - 19+ 3-dose series) Never done   HPV VACCINES (1 - 3-dose SCDM series) Never done   COVID-19 Vaccine (1 - 2025-26 season) 07/20/2024 (Originally 05/20/2024)   Influenza Vaccine  12/17/2024 (Originally 04/19/2024)   DTaP/Tdap/Td (3 - Td or Tdap) 07/05/2028   Hepatitis C Screening  Completed   HIV Screening  Completed   Pneumococcal Vaccine  Aged Out   Meningococcal B Vaccine  Aged Out      ----------------------------------------------------------------------------------------------------------------------------------------------------------------------------------------------------------------- Physical Exam BP 131/80 (BP Location: Left Arm, Patient Position: Sitting, Cuff Size: Large)   Pulse 74   Ht 5' 6 (1.676 m)   Wt 209 lb (94.8 kg)   SpO2 98%   BMI 33.73 kg/m   Physical Exam Constitutional:      Appearance: Normal appearance.  Eyes:     General: No scleral icterus. Cardiovascular:     Rate and Rhythm: Normal rate and regular rhythm.  Pulmonary:     Effort: Pulmonary effort is normal.     Breath sounds: Normal breath sounds.  Musculoskeletal:     Cervical back: Neck supple.     Comments: Lumbar spine normal to inspection and palpation.  Range of motion is limited with flexion due to pain.  Somewhat limited in extension due to pain.  Strength in bilateral lower extremities is normal.  Neurological:     Mental Status: He is alert.     ------------------------------------------------------------------------------------------------------------------------------------------------------------------------------------------------------------------- Assessment and Plan  Lumbar radiculitis He had x-rays completed this morning will await read on these.  Adding prednisone  taper.  Referral placed to physical therapy.  Note given for time away from work for a few days.   Meds ordered this encounter  Medications   predniSONE  (STERAPRED UNI-PAK 48 TAB) 10 MG (48) TBPK tablet    Sig: Take by mouth daily. 12-day taper pack, use as directed for taper    Dispense:  48 tablet    Refill:  0    No follow-ups on file.

## 2024-07-01 NOTE — Patient Instructions (Signed)
 Breakdown of Disks in the Spine (Degenerative Disk Disease): What to Know  Degenerative disk disease happens because of changes that affect the disks in your spine as you get older. These soft disks are located between the bones of your spine. They act like shock absorbers. Degenerative disk disease can affect the whole spine. But it's most common in the neck and lower back. As you age, many changes can occur in the spinal disks, such as: Disks may dry out and shrink. Small tears may occur in the tough outer covering of a disk. The disk space may become smaller due to loss of water. Abnormal growths in the bone (spurs) may occur. This can put pressure on the nerve roots leaving the spinal canal, causing pain. The spinal canal may become narrow. What are the causes? Degenerative disk disease may be caused by: Normal wear and tear as you age. Injuries. Certain activities or sports that cause damage. What increases the risk? Being overweight. Having a family history of degenerative disk disease. Smoking or using nicotine or tobacco. Sudden injury. Doing work that requires heavy lifting. What are the signs or symptoms? Symptoms include: Pain that varies in intensity. Some people have no pain, while others have severe pain. The location of the pain depends on the part of your spine that's affected. You may have: Pain in your neck or arm if a disk in your neck area is affected. Pain in your back, butt, or legs if a disk in your lower back is affected. Pain that gets worse while bending, reaching up, or doing twisting movements. Pain that may start slowly and get worse as time passes. It may also start after a major or minor injury. Numbness or tingling in the arms or legs. How is this diagnosed? Degenerative disk disease may be diagnosed based on: Your symptoms and medical history. A physical exam. Imaging tests, including: X-ray of the spine. CT scan. MRI. How is this  treated? Treatment may include: Medicines. Injection of steroids into the back. Rehab exercises. These activities aim to strengthen muscles in your back and belly to better support your spine. If treatments don't help to relieve your symptoms or if you have severe pain, you may need surgery. Follow these instructions at home: Medicines Take your medicines only as told. You may need to take steps to help treat or prevent trouble pooping (constipation), such as: Taking medicines to help you poop. Eating foods high in fiber, like beans, whole grains, and fresh fruits and vegetables. Drinking more fluids as told. Ask your health care provider if it's safe to drive or use machines while taking your medicine. Activity Rest as told. Get up to take short walks many times during the day. This helps you breathe better and keeps your blood flowing. Ask for help if you feel weak or unsteady. Ask what things are safe for you to do at home. Ask when you can go back to work or school. Do relaxation exercises as told by your provider. Maintain good posture. Follow proper lifting and walking techniques as told. Managing pain, stiffness, and swelling     Use ice or an ice pack as told. Icing can help to relieve pain. Place a towel between your skin and the ice. Leave the ice on for 20 minutes, 2-3 times a day. Use heat as told. Heat can reduce the stiffness of your muscles. Use the heat source that your provider recommends, such as a moist heat pack or a heating pad. Do  this as often as told. Place a towel between your skin and the heat source. Leave the heat on for 20-30 minutes. If your skin turns red, take off the ice or heat right away to prevent skin damage. The risk of damage is higher if you can't feel pain, heat, or cold. General instructions Change your sitting, standing, and sleeping habits as told by your provider. Avoid sitting in the same position for long periods of time. Change  positions often. Lose weight or stay at a healthy weight as told. Do not smoke, vape, or use nicotine or tobacco. Wear supportive footwear. Keep all follow-up visits. This may include visits for physical therapy. Contact a health care provider if: You have pain that doesn't go away within 1-4 weeks. You lose your appetite. You lose weight without trying. You have fevers or night sweats. Get help right away if: You have severe pain. You have weakness in your arms, hands, or legs. You start to lose control of when you pee or poop. This information is not intended to replace advice given to you by your health care provider. Make sure you discuss any questions you have with your health care provider. Document Revised: 09/28/2023 Document Reviewed: 09/28/2023 Elsevier Patient Education  2025 [employer].

## 2024-07-04 ENCOUNTER — Encounter: Payer: Self-pay | Admitting: Family Medicine

## 2024-07-07 DIAGNOSIS — M5416 Radiculopathy, lumbar region: Secondary | ICD-10-CM | POA: Insufficient documentation

## 2024-07-07 NOTE — Assessment & Plan Note (Signed)
 He had x-rays completed this morning will await read on these.  Adding prednisone  taper.  Referral placed to physical therapy.  Note given for time away from work for a few days.

## 2024-08-29 ENCOUNTER — Emergency Department (HOSPITAL_COMMUNITY)
Admission: EM | Admit: 2024-08-29 | Discharge: 2024-08-30 | Discharge status: Against Medical Advice | Attending: Emergency Medicine | Admitting: Emergency Medicine

## 2024-08-29 ENCOUNTER — Other Ambulatory Visit: Payer: Self-pay

## 2024-08-29 ENCOUNTER — Emergency Department (HOSPITAL_BASED_OUTPATIENT_CLINIC_OR_DEPARTMENT_OTHER)
Admission: EM | Admit: 2024-08-29 | Discharge: 2024-08-29 | Discharge status: Against Medical Advice | Attending: Emergency Medicine | Admitting: Emergency Medicine

## 2024-08-29 ENCOUNTER — Encounter (HOSPITAL_COMMUNITY): Payer: Self-pay

## 2024-08-29 ENCOUNTER — Encounter (HOSPITAL_BASED_OUTPATIENT_CLINIC_OR_DEPARTMENT_OTHER): Payer: Self-pay

## 2024-08-29 ENCOUNTER — Emergency Department (HOSPITAL_COMMUNITY)

## 2024-08-29 DIAGNOSIS — I10 Essential (primary) hypertension: Secondary | ICD-10-CM | POA: Insufficient documentation

## 2024-08-29 DIAGNOSIS — R29898 Other symptoms and signs involving the musculoskeletal system: Secondary | ICD-10-CM | POA: Insufficient documentation

## 2024-08-29 DIAGNOSIS — M545 Low back pain, unspecified: Secondary | ICD-10-CM | POA: Insufficient documentation

## 2024-08-29 DIAGNOSIS — R200 Anesthesia of skin: Secondary | ICD-10-CM | POA: Insufficient documentation

## 2024-08-29 DIAGNOSIS — Z79899 Other long term (current) drug therapy: Secondary | ICD-10-CM | POA: Insufficient documentation

## 2024-08-29 DIAGNOSIS — Z5321 Procedure and treatment not carried out due to patient leaving prior to being seen by health care provider: Secondary | ICD-10-CM | POA: Insufficient documentation

## 2024-08-29 MED ORDER — DEXAMETHASONE 4 MG PO TABS
10.0000 mg | ORAL_TABLET | Freq: Once | ORAL | Status: AC
  Administered 2024-08-29: 10 mg via ORAL
  Filled 2024-08-29: qty 3

## 2024-08-29 MED ORDER — OXYCODONE HCL 5 MG PO TABS
5.0000 mg | ORAL_TABLET | Freq: Once | ORAL | Status: AC
  Administered 2024-08-29: 5 mg via ORAL
  Filled 2024-08-29: qty 1

## 2024-08-29 MED ORDER — IBUPROFEN 400 MG PO TABS
400.0000 mg | ORAL_TABLET | Freq: Once | ORAL | Status: AC
  Administered 2024-08-29: 400 mg via ORAL
  Filled 2024-08-29: qty 1

## 2024-08-29 MED ORDER — ACETAMINOPHEN 500 MG PO TABS
1000.0000 mg | ORAL_TABLET | Freq: Once | ORAL | Status: AC
  Administered 2024-08-29: 1000 mg via ORAL
  Filled 2024-08-29: qty 2

## 2024-08-29 MED ORDER — LORAZEPAM 1 MG PO TABS
0.5000 mg | ORAL_TABLET | Freq: Once | ORAL | Status: AC
  Administered 2024-08-29: 0.5 mg via ORAL
  Filled 2024-08-29: qty 1

## 2024-08-29 NOTE — ED Notes (Signed)
 Pt came back from his MRI and stated he wanted to leave because he was tired of waiting. Pts family and pt seen walking out. I advised pt if s/s became worse to come back to be seen. Pt agreed he would and would be looking at his results in [place].

## 2024-08-29 NOTE — ED Provider Triage Note (Signed)
 Emergency Medicine Provider Triage Evaluation Note  PHARELL ROLFSON , a 38 y.o. male  was evaluated in triage.  Pt complains of back pain. Was seen earlier today at [facility] by Dr. Dreama. He was sent to [facility] POV for MRI lumbar spine for LE weakness/numbness in L5-S1. He did not come directly to [facility] and was discharged from the system. Arrives now for indicated study.  Review of Systems  Positive: Back pain 6/10 Negative: incontinence  Physical Exam  BP (!) 165/99 (BP Location: Right Arm)   Pulse (!) 121   Temp 98.2 F (36.8 C)   Resp (!) 22   SpO2 95%  Gen:   Awake, no distress   Resp:  Normal effort  MSK:   Moves extremities without difficulty  Other:    Medical Decision Making  Medically screening exam initiated at 3:27 PM.  Appropriate orders placed.  Tayshaun D J Bauza was informed that the remainder of the evaluation will be completed by another provider, this initial triage assessment does not replace that evaluation, and the importance of remaining in the ED until their evaluation is complete.  MRI order re-entered.    Odell Balls, PA-C 08/29/24 1530

## 2024-08-29 NOTE — ED Triage Notes (Signed)
 Quick triage: Pt to ED from [facility] for MRI. Presented for lower back injury and leg numbness.  ambulatory

## 2024-08-29 NOTE — ED Provider Notes (Addendum)
 [place] [facility] AT [facility] Provider Note   CSN: [id number] Arrival date & time: 08/29/24  0710     Patient presents with: Numbness   Donald Russell is a 38 y.o. male.   HPI    38 year old male with a history of hypertension, hyperlipidemia, thyroid  disease, diagnosis of lumbar radiculopathy from PCP who presents with new increased back pain and RLE weakness and numbness.  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   He went to bed last night and when he woke up this morning he got out of bed and fell due to leg weakness. Noted increased pain in center of his back and pain like my nerves in his right leg.  Has not felt the urge to urinate yet today, denies known urinary retention, denies urinary or bowel incontinence.  No falls preceding the increased pain yesterday, no hx of IVDU, no hx of fever, no known hx of cancer.  No abdominal pain, nausea, vomiting or other symptoms.  Has numbness to right leg. He felt like legs were weak this morning, was able to ambulate into ED but has continued sensation of weakness in right leg.    Past Medical History:  Diagnosis Date   Hyperlipidemia    Hypertension    Thyroid  disease      Prior to Admission medications  Medication Sig Start Date End Date Taking? Authorizing Provider  amLODipine  (NORVASC ) 5 MG tablet TAKE 1 TABLET BY MOUTH DAILY 01/26/24   Alvia Bring, DO  fluconazole  (DIFLUCAN ) 150 MG tablet Take 1 tablet (150 mg total) by mouth once a week. 05/21/24   Alvia Bring, DO  ketoconazole  (NIZORAL ) 2 % cream Apply 1 Application topically 2 (two) times daily. To affected areas. 07/13/23   Bevin Bernice RAMAN, DO  meloxicam  (MOBIC ) 15 MG tablet ONE TAB BY MOUTH EVERY 24 HOURS WITH A MEAL FOR 2 WEEKS, THEN ONCE EVERY 24 HOURS AS NEEDED FOR PAIN 09/25/23   Curtis Debby JINNY, MD   nystatin  (MYCOSTATIN /NYSTOP ) powder Apply 1 Application topically 3 (three) times daily. 07/13/23   Bevin Bernice RAMAN, DO  predniSONE  (STERAPRED UNI-PAK 48 TAB) 10 MG (48) TBPK tablet Take by mouth daily. 12-day taper pack, use as directed for taper 07/01/24   Alvia Bring, DO  escitalopram  (LEXAPRO ) 10 MG tablet Take 1/2 tablet (5mg ) daily x 8 days then increase to 1 tablet (10mg ) daily. 10/14/20 02/04/21  Alvia Bring, DO    Allergies: Patient has no known allergies.    Review of Systems  Updated Vital Signs BP (!) 176/117   Pulse 94   Temp 98.2 F (36.8 C) (Oral)   Resp 18   Ht 5' 6 (1.676 m)   Wt 93 kg   SpO2 98%   BMI 33.09 kg/m   Physical Exam Vitals and nursing note reviewed.  Constitutional:      General: He is not in acute distress.    Appearance: He is well-developed. He is not diaphoretic.  HENT:     Head: Normocephalic and atraumatic.  Eyes:     Conjunctiva/sclera: Conjunctivae normal.  Cardiovascular:     Rate and Rhythm: Normal rate and regular rhythm.     Pulses: Normal pulses.  Pulmonary:     Effort: Pulmonary effort is normal. No respiratory distress.     Breath sounds: Normal breath sounds.  Abdominal:  General: There is no distension.     Palpations: Abdomen is soft.     Tenderness: There is no abdominal tenderness. There is no guarding.  Musculoskeletal:        General: Tenderness (low lumbar spine) present.     Cervical back: Normal range of motion.  Skin:    General: Skin is warm and dry.  Neurological:     Mental Status: He is alert and oriented to person, place, and time.     Sensory: Sensory deficit (lateral side of right foot with numbness) present.     Motor: Weakness (great toe extension/flexion with weakness, flexion of knee on right, hip flexion/knee extension possible slight weakness or strength limitation but not as severe as toe/knee flexion) present.     (all labs ordered are listed, but only abnormal results are  displayed) Labs Reviewed - No data to display  EKG: None  Radiology: No results found.   Procedures   Medications Ordered in the ED  oxyCODONE (Oxy IR/ROXICODONE) immediate release tablet 5 mg (5 mg Oral Given 08/29/24 0752)  dexamethasone (DECADRON) tablet 10 mg (10 mg Oral Given 08/29/24 0752)  acetaminophen (TYLENOL) tablet 1,000 mg (1,000 mg Oral Given 08/29/24 0752)  ibuprofen  (ADVIL ) tablet 400 mg (400 mg Oral Given 08/29/24 0752)  LORazepam (ATIVAN) tablet 0.5 mg (0.5 mg Oral Given 08/29/24 0800)                                     38 year old male with a history of hypertension, hyperlipidemia, thyroid  disease, diagnosis of lumbar radiculopathy from PCP who presents with new increased back pain and RLE weakness and numbness.  No signs of intraabdominal etiology of symptoms, aortic dissection, low suspicion for fracture, epidural abscess, osteomyelitis-and overall lower suspicion for cauda equina in absence of saddle anesthesia, symptoms of urinary retention/bowel incontinence--however has noted weakness/numbness L5-S1 distribution and feel emergent MRI indicated.  He is requesting only oral medications at this time (notes fear of needles is more than claustrophobia)--given him medications in the ED (including pain medications and steroids). Will transfer POV with partner for MRI at [facility] and discussion with [facility] pending MRI results.      Final diagnoses:  Acute right-sided low back pain without sciatica  Weakness of right lower extremity    ED Discharge Orders     None         Dreama Longs, MD 08/29/24 [phone]

## 2024-08-29 NOTE — ED Notes (Signed)
 Pt was never seen at [facility] ED via transfer POV. I called [facility] and spoke to a nurse who stated they never received this patient. When I called the patient's number listed in his chart it went straight to voicemail. I then called his emergency contact who stated the patient was at home and sleepy from the medications we gave here. I explained to the mother of the patient that those meds were given for the MRI, and that he needs to go to [facility]. Mother verbalized understanding and is taking the patient there.  Charge and EDP made aware.

## 2024-08-29 NOTE — ED Triage Notes (Signed)
 See Quick Triage note.  Pt sent to ED for MRI d/t lower back pain and BLE numbness.  Pt reports back injury x2 months ago.  Pt ambulatory to triage.

## 2024-08-29 NOTE — ED Triage Notes (Signed)
 Pt states he hurt his lower back a month ago and re-injured it yesterday. Presents today for leg numbness that started at around 0515. States he was unable to ambulate this morning, but can walk now.  Pt ambulated independently to room.

## 2024-08-31 ENCOUNTER — Other Ambulatory Visit: Payer: Self-pay | Admitting: Family Medicine

## 2024-08-31 DIAGNOSIS — I10 Essential (primary) hypertension: Secondary | ICD-10-CM
